# Patient Record
Sex: Male | Born: 1965
Health system: Southern US, Community
[De-identification: ages and names within clinical notes are randomized; demographics above are authoritative.]

## PROBLEM LIST (undated history)

## (undated) DIAGNOSIS — L409 Psoriasis, unspecified: Secondary | ICD-10-CM

## (undated) DIAGNOSIS — T8859XA Other complications of anesthesia, initial encounter: Secondary | ICD-10-CM

## (undated) DIAGNOSIS — R112 Nausea with vomiting, unspecified: Secondary | ICD-10-CM

## (undated) DIAGNOSIS — K219 Gastro-esophageal reflux disease without esophagitis: Secondary | ICD-10-CM

## (undated) DIAGNOSIS — Z9889 Other specified postprocedural states: Secondary | ICD-10-CM

## (undated) DIAGNOSIS — E119 Type 2 diabetes mellitus without complications: Secondary | ICD-10-CM

## (undated) HISTORY — DX: Gastro-esophageal reflux disease without esophagitis: K21.9

## (undated) HISTORY — PX: WISDOM TOOTH EXTRACTION: SHX21

---

## 1993-08-20 HISTORY — PX: OTHER SURGICAL HISTORY: SHX169

## 2018-03-26 ENCOUNTER — Encounter (HOSPITAL_COMMUNITY): Payer: Self-pay

## 2018-03-26 ENCOUNTER — Ambulatory Visit (HOSPITAL_COMMUNITY)
Admission: EM | Admit: 2018-03-26 | Discharge: 2018-03-26 | Disposition: A | Discharge status: Home / Self Care | Payer: Self-pay | Attending: Family Medicine | Admitting: Family Medicine

## 2018-03-26 ENCOUNTER — Other Ambulatory Visit: Payer: Self-pay

## 2018-03-26 DIAGNOSIS — K219 Gastro-esophageal reflux disease without esophagitis: Secondary | ICD-10-CM

## 2018-03-26 DIAGNOSIS — M79652 Pain in left thigh: Secondary | ICD-10-CM

## 2018-03-26 DIAGNOSIS — M79651 Pain in right thigh: Secondary | ICD-10-CM

## 2018-03-26 DIAGNOSIS — J029 Acute pharyngitis, unspecified: Secondary | ICD-10-CM

## 2018-03-26 LAB — POCT I-STAT, CHEM 8
BUN: 21 mg/dL — AB (ref 6–20)
CALCIUM ION: 1.09 mmol/L — AB (ref 1.15–1.40)
CREATININE: 0.9 mg/dL (ref 0.61–1.24)
Chloride: 102 mmol/L (ref 98–111)
GLUCOSE: 131 mg/dL — AB (ref 70–99)
HCT: 48 % (ref 39.0–52.0)
HEMOGLOBIN: 16.3 g/dL (ref 13.0–17.0)
Potassium: 3.8 mmol/L (ref 3.5–5.1)
Sodium: 141 mmol/L (ref 135–145)
TCO2: 26 mmol/L (ref 22–32)

## 2018-03-26 MED ORDER — OMEPRAZOLE 20 MG PO CPDR: 20.0000 mg | DELAYED_RELEASE_CAPSULE | Freq: Every day | ORAL | 1 refills | Status: DC

## 2018-03-26 MED ORDER — RANITIDINE HCL 150 MG PO CAPS: 150.0000 mg | ORAL_CAPSULE | Freq: Every day | ORAL | 0 refills | Status: DC

## 2018-03-26 NOTE — Discharge Instructions (Signed)
I believe your symptoms are associated with acid reflux.  °We are going to try omeprazole 20 mg once daily and zantac 150 mg at night.  °Make sure that you take the omeprazole 30- 60 minutes prior to a meal with a glass of water.  °Avoid spicy, greasy foods, caffeine, chocolate and milk products.  °No eating 2-3 hours before bedtime. Elevate the head of the bed 30 degrees.  °Try this for a few weeks to see if this improves your symptoms.  °If you don't see any improvement or your symptoms worsen please follow up with a GI   °

## 2018-03-26 NOTE — ED Triage Notes (Signed)
Pt feels like he's choking this has been going on for 3 week this comes and goes. And the upper thigh pain 4 weeks.

## 2018-03-26 NOTE — ED Provider Notes (Signed)
[facility]    CSN: [id number] Arrival date & time: 03/26/18  1220     History   Chief Complaint Chief Complaint  Patient presents with  . Sore Throat  . Leg Pain    HPI Richard Mullins is a 52 y.o. male.   Patient is a 52 year old male with no recorded past medical history.  He reports that he has not been to a doctor in over 15 years.  He presents today with 3 weeks of sore throat, choking sensation.  He reports that this is worse at times eating a meal and laying down.  He reports a history of acid reflux when he was younger.  He denies any associated fever, chills, body aches.  He denies any nausea, vomiting, diarrhea, constipation or blood in stools.  Denies any abdominal pain, chest pain, shortness of breath.  He admits to eating a lot of spicy, greasy foods.  He is also had a lot of drainage in the back of his throat.  Sometimes he takes Benadryl for this but mostly at night.  He does not drive and rides a bicycle everywhere he goes.   He is also complaining of bilateral thigh pain.  This is intermittent and he describes it as a muscle spasm.  This pain is worse when he is lying down or has been sitting for long periods.  The pain gets better when he is active and rides his bike.  He reports that he sometimes gets dehydrated and gets a headache from this.  He denies any calf pain.  He does have discoloration to bilateral lower extremities without swelling or pain.  He reports that this is been there for over 10 years and he suffers from psoriasis.   He does not smoke, drink or do any drugs.  He denies any history of DVT, PE.  He has had no recent traveling distance.   ROS per HPI      History reviewed. No pertinent past medical history.  There are no active problems to display for this patient.   History reviewed. No pertinent surgical history.     Home Medications    Prior to Admission medications   Not on File    Family History History reviewed. No  pertinent family history.  Social History Social History   Tobacco Use  . Smoking status: Never Smoker  . Smokeless tobacco: Never Used  Substance Use Topics  . Alcohol use: Yes    Comment: occ  . Drug use: Not Currently     Allergies   Patient has no allergy information on record.   Review of Systems Review of Systems   Physical Exam Triage Vital Signs ED Triage Vitals [03/26/18 1249]  Enc Vitals Group     BP (!) 166/84     Pulse Rate 88     Resp 18     Temp 98.4 F (36.9 C)     Temp src      SpO2 98 %     Weight (!) 350 lb (158.8 kg)     Height      Head Circumference      Peak Flow      Pain Score 3     Pain Loc      Pain Edu?      Excl. in GC?    No data found.  Updated Vital Signs BP (!) 166/84   Pulse 88   Temp 98.4 F (36.9 C)   Resp 18  Wt (!) 350 lb (158.8 kg)   SpO2 98%   Visual Acuity Right Eye Distance:   Left Eye Distance:   Bilateral Distance:    Right Eye Near:   Left Eye Near:    Bilateral Near:     Physical Exam  Constitutional: He appears well-developed and well-nourished.  Non-toxic appearance. He does not appear ill.  Obese  HENT:  Head: Normocephalic and atraumatic.  Right Ear: Hearing, tympanic membrane and ear canal normal.  Left Ear: Hearing, tympanic membrane and ear canal normal.  Mouth/Throat: Uvula is midline and mucous membranes are normal. No oral lesions. No uvula swelling. No oropharyngeal exudate, posterior oropharyngeal edema, posterior oropharyngeal erythema or tonsillar abscesses. Tonsils are 0 on the right. Tonsils are 0 on the left. No tonsillar exudate.  Drainage noted at the back of throat  Eyes: Pupils are equal, round, and reactive to light.  Cardiovascular: Normal rate, regular rhythm and normal heart sounds.  Pulmonary/Chest: Effort normal and breath sounds normal.  Lymphadenopathy:    He has no cervical adenopathy.  Neurological: He is alert.  Skin: Skin is warm and dry. Capillary refill takes  less than 2 seconds.  Discoloration to bilateral lower extremities, consistent with PVD.  No edema,  pain with palpation, increased warmth.  Sensation intact, distal pulses intact.  No skin breakdown  Psychiatric: He has a normal mood and affect.  Nursing note and vitals reviewed.    UC Treatments / Results  Labs (all labs ordered are listed, but only abnormal results are displayed) Labs Reviewed  POCT I-STAT, CHEM 8 - Abnormal; Notable for the following components:      Result Value   BUN 21 (*)    Glucose, Bld 131 (*)    Calcium, Ion 1.09 (*)    All other components within normal limits    EKG None  Radiology No results found.  Procedures Procedures (including critical care time)  Medications Ordered in UC Medications - No data to display  Initial Impression / Assessment and Plan / UC Course  I have reviewed the triage vital signs and the nursing notes.  Pertinent labs & imaging results that were available during my care of the patient were reviewed by me and considered in my medical decision making (see chart for details).     Blood work is normal.  Choking sensation and sore throat most likely due to acid reflux.  Will start omeprazole daily and Zantac as needed.  Instructions on foods to avoid given.  He can also try daily Zyrtec for allergies this may help.   The bilateral upper thigh cramping and spasming is most likely due to dehydration or electrolyte imbalance.  He is outside a lot riding his bike and has a history of dehydration.  He needs to ensure that he is drinking plenty of fluids and replacing electrolytes.  No concern for DVT.   I recommend he establish care with a primary care provider for routine health maintenance.  Patient agreeable to plan     Final Clinical Impressions(s) / UC Diagnoses   Final diagnoses:  None   Discharge Instructions   None    ED Prescriptions    None     Controlled Substance Prescriptions [place] Controlled Substance  Registry consulted? Not Applicable   Janace ArisBast, Javid Kemler A, NP 03/26/18 1432

## 2020-08-20 DIAGNOSIS — Z8616 Personal history of COVID-19: Secondary | ICD-10-CM

## 2020-08-20 HISTORY — DX: Personal history of COVID-19: Z86.16

## 2020-09-20 ENCOUNTER — Emergency Department (HOSPITAL_COMMUNITY): Payer: Self-pay

## 2020-09-20 ENCOUNTER — Encounter (HOSPITAL_COMMUNITY): Payer: Self-pay | Admitting: Emergency Medicine

## 2020-09-20 ENCOUNTER — Inpatient Hospital Stay (HOSPITAL_COMMUNITY): Payer: Self-pay

## 2020-09-20 ENCOUNTER — Other Ambulatory Visit: Payer: Self-pay

## 2020-09-20 ENCOUNTER — Inpatient Hospital Stay (HOSPITAL_COMMUNITY)
Admission: EM | Admit: 2020-09-20 | Discharge: 2020-09-26 | DRG: 637 | Disposition: A | Discharge status: Home / Self Care | Payer: Self-pay | Attending: Internal Medicine | Admitting: Internal Medicine

## 2020-09-20 DIAGNOSIS — E872 Acidosis, unspecified: Secondary | ICD-10-CM | POA: Diagnosis present

## 2020-09-20 DIAGNOSIS — K5900 Constipation, unspecified: Secondary | ICD-10-CM | POA: Diagnosis present

## 2020-09-20 DIAGNOSIS — Z888 Allergy status to other drugs, medicaments and biological substances status: Secondary | ICD-10-CM

## 2020-09-20 DIAGNOSIS — D696 Thrombocytopenia, unspecified: Secondary | ICD-10-CM | POA: Diagnosis present

## 2020-09-20 DIAGNOSIS — R112 Nausea with vomiting, unspecified: Secondary | ICD-10-CM

## 2020-09-20 DIAGNOSIS — Z20822 Contact with and (suspected) exposure to covid-19: Secondary | ICD-10-CM | POA: Diagnosis present

## 2020-09-20 DIAGNOSIS — R739 Hyperglycemia, unspecified: Secondary | ICD-10-CM

## 2020-09-20 DIAGNOSIS — E1110 Type 2 diabetes mellitus with ketoacidosis without coma: Principal | ICD-10-CM | POA: Diagnosis present

## 2020-09-20 DIAGNOSIS — I2699 Other pulmonary embolism without acute cor pulmonale: Secondary | ICD-10-CM | POA: Diagnosis present

## 2020-09-20 DIAGNOSIS — E669 Obesity, unspecified: Secondary | ICD-10-CM | POA: Diagnosis present

## 2020-09-20 DIAGNOSIS — Z8616 Personal history of COVID-19: Secondary | ICD-10-CM

## 2020-09-20 DIAGNOSIS — G9341 Metabolic encephalopathy: Secondary | ICD-10-CM | POA: Diagnosis present

## 2020-09-20 DIAGNOSIS — R7989 Other specified abnormal findings of blood chemistry: Secondary | ICD-10-CM | POA: Diagnosis present

## 2020-09-20 DIAGNOSIS — I4891 Unspecified atrial fibrillation: Secondary | ICD-10-CM | POA: Diagnosis not present

## 2020-09-20 DIAGNOSIS — Z6837 Body mass index (BMI) 37.0-37.9, adult: Secondary | ICD-10-CM

## 2020-09-20 DIAGNOSIS — L409 Psoriasis, unspecified: Secondary | ICD-10-CM | POA: Diagnosis present

## 2020-09-20 DIAGNOSIS — Z86718 Personal history of other venous thrombosis and embolism: Secondary | ICD-10-CM

## 2020-09-20 DIAGNOSIS — I248 Other forms of acute ischemic heart disease: Secondary | ICD-10-CM | POA: Diagnosis present

## 2020-09-20 DIAGNOSIS — R634 Abnormal weight loss: Secondary | ICD-10-CM

## 2020-09-20 DIAGNOSIS — Z801 Family history of malignant neoplasm of trachea, bronchus and lung: Secondary | ICD-10-CM

## 2020-09-20 DIAGNOSIS — E860 Dehydration: Secondary | ICD-10-CM | POA: Diagnosis present

## 2020-09-20 DIAGNOSIS — R000 Tachycardia, unspecified: Secondary | ICD-10-CM | POA: Diagnosis present

## 2020-09-20 DIAGNOSIS — N179 Acute kidney failure, unspecified: Secondary | ICD-10-CM | POA: Diagnosis present

## 2020-09-20 DIAGNOSIS — Z800 Family history of malignant neoplasm of digestive organs: Secondary | ICD-10-CM

## 2020-09-20 DIAGNOSIS — E870 Hyperosmolality and hypernatremia: Secondary | ICD-10-CM | POA: Diagnosis present

## 2020-09-20 DIAGNOSIS — K760 Fatty (change of) liver, not elsewhere classified: Secondary | ICD-10-CM | POA: Diagnosis present

## 2020-09-20 DIAGNOSIS — E119 Type 2 diabetes mellitus without complications: Secondary | ICD-10-CM | POA: Diagnosis present

## 2020-09-20 DIAGNOSIS — Z794 Long term (current) use of insulin: Secondary | ICD-10-CM | POA: Diagnosis present

## 2020-09-20 HISTORY — DX: Nausea with vomiting, unspecified: Z98.890

## 2020-09-20 HISTORY — DX: Other complications of anesthesia, initial encounter: T88.59XA

## 2020-09-20 HISTORY — DX: Nausea with vomiting, unspecified: R11.2

## 2020-09-20 HISTORY — DX: Type 2 diabetes mellitus without complications: E11.9

## 2020-09-20 HISTORY — DX: Personal history of other venous thrombosis and embolism: Z86.718

## 2020-09-20 HISTORY — DX: Unspecified atrial fibrillation: I48.91

## 2020-09-20 HISTORY — DX: Psoriasis, unspecified: L40.9

## 2020-09-20 LAB — COMPREHENSIVE METABOLIC PANEL
ALT: 54 U/L — ABNORMAL HIGH (ref 0–44)
AST: 32 U/L (ref 15–41)
Albumin: 4 g/dL (ref 3.5–5.0)
Alkaline Phosphatase: 65 U/L (ref 38–126)
Anion gap: 31 — ABNORMAL HIGH (ref 5–15)
BUN: 42 mg/dL — ABNORMAL HIGH (ref 6–20)
CO2: 17 mmol/L — ABNORMAL LOW (ref 22–32)
Calcium: 11.4 mg/dL — ABNORMAL HIGH (ref 8.9–10.3)
Chloride: 101 mmol/L (ref 98–111)
Creatinine, Ser: 2.36 mg/dL — ABNORMAL HIGH (ref 0.61–1.24)
GFR, Estimated: 32 mL/min — ABNORMAL LOW (ref >60)
Glucose, Bld: 1004 mg/dL (ref 70–99)
Potassium: 4.9 mmol/L (ref 3.5–5.1)
Sodium: 149 mmol/L — ABNORMAL HIGH (ref 135–145)
Total Bilirubin: 2.2 mg/dL — ABNORMAL HIGH (ref 0.3–1.2)
Total Protein: 8.5 g/dL — ABNORMAL HIGH (ref 6.5–8.1)

## 2020-09-20 LAB — URINALYSIS, ROUTINE W REFLEX MICROSCOPIC
Bilirubin Urine: NEGATIVE
Glucose, UA: >=500 mg/dL — AB
Ketones, ur: 15 mg/dL — AB
Leukocytes,Ua: NEGATIVE
Nitrite: NEGATIVE
Protein, ur: NEGATIVE mg/dL
Specific Gravity, Urine: 1.015 (ref 1.005–1.030)
pH: 5.5 (ref 5.0–8.0)

## 2020-09-20 LAB — BASIC METABOLIC PANEL
Anion gap: 23 — ABNORMAL HIGH (ref 5–15)
Anion gap: 13 (ref 5–15)
BUN: 43 mg/dL — ABNORMAL HIGH (ref 6–20)
BUN: 42 mg/dL — ABNORMAL HIGH (ref 6–20)
CO2: 19 mmol/L — ABNORMAL LOW (ref 22–32)
CO2: 24 mmol/L (ref 22–32)
Calcium: 11.2 mg/dL — ABNORMAL HIGH (ref 8.9–10.3)
Calcium: 10.6 mg/dL — ABNORMAL HIGH (ref 8.9–10.3)
Chloride: 114 mmol/L — ABNORMAL HIGH (ref 98–111)
Chloride: 119 mmol/L — ABNORMAL HIGH (ref 98–111)
Creatinine, Ser: 2.27 mg/dL — ABNORMAL HIGH (ref 0.61–1.24)
Creatinine, Ser: 1.81 mg/dL — ABNORMAL HIGH (ref 0.61–1.24)
GFR, Estimated: 33 mL/min — ABNORMAL LOW (ref >60)
GFR, Estimated: 44 mL/min — ABNORMAL LOW (ref >60)
Glucose, Bld: 734 mg/dL (ref 70–99)
Glucose, Bld: 462 mg/dL — ABNORMAL HIGH (ref 70–99)
Potassium: 4.1 mmol/L (ref 3.5–5.1)
Potassium: 3.6 mmol/L (ref 3.5–5.1)
Sodium: 156 mmol/L — ABNORMAL HIGH (ref 135–145)
Sodium: 156 mmol/L — ABNORMAL HIGH (ref 135–145)

## 2020-09-20 LAB — CBG MONITORING, ED
Glucose-Capillary: 254 mg/dL — ABNORMAL HIGH (ref 70–99)
Glucose-Capillary: 339 mg/dL — ABNORMAL HIGH (ref 70–99)
Glucose-Capillary: 358 mg/dL — ABNORMAL HIGH (ref 70–99)
Glucose-Capillary: 407 mg/dL — ABNORMAL HIGH (ref 70–99)
Glucose-Capillary: 417 mg/dL — ABNORMAL HIGH (ref 70–99)
Glucose-Capillary: 419 mg/dL — ABNORMAL HIGH (ref 70–99)
Glucose-Capillary: 480 mg/dL — ABNORMAL HIGH (ref 70–99)
Glucose-Capillary: 551 mg/dL (ref 70–99)
Glucose-Capillary: >600 mg/dL (ref 70–99)
Glucose-Capillary: >600 mg/dL (ref 70–99)
Glucose-Capillary: >600 mg/dL (ref 70–99)
Glucose-Capillary: >600 mg/dL (ref 70–99)

## 2020-09-20 LAB — LIPASE, BLOOD: Lipase: 48 U/L (ref 11–51)

## 2020-09-20 LAB — I-STAT VENOUS BLOOD GAS, ED
Acid-base deficit: 7 mmol/L — ABNORMAL HIGH (ref 0.0–2.0)
Bicarbonate: 17.4 mmol/L — ABNORMAL LOW (ref 20.0–28.0)
Calcium, Ion: 1.31 mmol/L (ref 1.15–1.40)
HCT: 55 % — ABNORMAL HIGH (ref 39.0–52.0)
Hemoglobin: 18.7 g/dL — ABNORMAL HIGH (ref 13.0–17.0)
O2 Saturation: 92 %
Potassium: 4.6 mmol/L (ref 3.5–5.1)
Sodium: 152 mmol/L — ABNORMAL HIGH (ref 135–145)
TCO2: 18 mmol/L — ABNORMAL LOW (ref 22–32)
pCO2, Ven: 32.7 mmHg — ABNORMAL LOW (ref 44.0–60.0)
pH, Ven: 7.333 (ref 7.250–7.430)
pO2, Ven: 68 mmHg — ABNORMAL HIGH (ref 32.0–45.0)

## 2020-09-20 LAB — LACTIC ACID, PLASMA
Lactic Acid, Venous: 3.2 mmol/L (ref 0.5–1.9)
Lactic Acid, Venous: 2.9 mmol/L (ref 0.5–1.9)
Lactic Acid, Venous: 2.3 mmol/L (ref 0.5–1.9)

## 2020-09-20 LAB — CBC WITH DIFFERENTIAL/PLATELET
Abs Immature Granulocytes: 0.05 10*3/uL (ref 0.00–0.07)
Basophils Absolute: 0 10*3/uL (ref 0.0–0.1)
Basophils Relative: 0 %
Eosinophils Absolute: 0 10*3/uL (ref 0.0–0.5)
Eosinophils Relative: 0 %
HCT: 59.6 % — ABNORMAL HIGH (ref 39.0–52.0)
Hemoglobin: 18.6 g/dL — ABNORMAL HIGH (ref 13.0–17.0)
Immature Granulocytes: 0 %
Lymphocytes Relative: 14 %
Lymphs Abs: 1.7 10*3/uL (ref 0.7–4.0)
MCH: 31.2 pg (ref 26.0–34.0)
MCHC: 31.2 g/dL (ref 30.0–36.0)
MCV: 100 fL (ref 80.0–100.0)
Monocytes Absolute: 0.9 10*3/uL (ref 0.1–1.0)
Monocytes Relative: 8 %
Neutro Abs: 9.8 10*3/uL — ABNORMAL HIGH (ref 1.7–7.7)
Neutrophils Relative %: 78 %
Platelets: 296 10*3/uL (ref 150–400)
RBC: 5.96 MIL/uL — ABNORMAL HIGH (ref 4.22–5.81)
RDW: 13.2 % (ref 11.5–15.5)
WBC: 12.5 10*3/uL — ABNORMAL HIGH (ref 4.0–10.5)
nRBC: 0 % (ref 0.0–0.2)

## 2020-09-20 LAB — OSMOLALITY: Osmolality: 412 mOsm/kg (ref 275–295)

## 2020-09-20 LAB — URINALYSIS, MICROSCOPIC (REFLEX)

## 2020-09-20 LAB — SARS CORONAVIRUS 2 (TAT 6-24 HRS): SARS Coronavirus 2: NEGATIVE

## 2020-09-20 LAB — HIV ANTIBODY (ROUTINE TESTING W REFLEX): HIV Screen 4th Generation wRfx: NONREACTIVE

## 2020-09-20 LAB — BETA-HYDROXYBUTYRIC ACID: Beta-Hydroxybutyric Acid: >8 mmol/L — ABNORMAL HIGH (ref 0.05–0.27)

## 2020-09-20 MED ORDER — SENNOSIDES-DOCUSATE SODIUM 8.6-50 MG PO TABS
2.0000 | ORAL_TABLET | Freq: Two times a day (BID) | ORAL | Status: DC
  Administered 2020-09-20 – 2020-09-24 (×6): 2 via ORAL
  Filled 2020-09-20 (×9): qty 2

## 2020-09-20 MED ORDER — LACTULOSE 10 GM/15ML PO SOLN
20.0000 g | Freq: Three times a day (TID) | ORAL | Status: DC
  Administered 2020-09-20 – 2020-09-24 (×6): 20 g via ORAL
  Filled 2020-09-20 (×11): qty 30

## 2020-09-20 MED ORDER — HEPARIN SODIUM (PORCINE) 5000 UNIT/ML IJ SOLN
5000.0000 [IU] | Freq: Three times a day (TID) | INTRAMUSCULAR | Status: DC
  Administered 2020-09-20 – 2020-09-22 (×5): 5000 [IU] via SUBCUTANEOUS
  Filled 2020-09-20 (×6): qty 1

## 2020-09-20 MED ORDER — LACTATED RINGERS IV BOLUS: 20.0000 mL/kg | Freq: Once | INTRAVENOUS | Status: DC

## 2020-09-20 MED ORDER — POTASSIUM CHLORIDE 10 MEQ/100ML IV SOLN
INTRAVENOUS | Status: AC
  Administered 2020-09-20: 10 meq via INTRAVENOUS
  Filled 2020-09-20: qty 100

## 2020-09-20 MED ORDER — INSULIN REGULAR(HUMAN) IN NACL 100-0.9 UT/100ML-% IV SOLN
INTRAVENOUS | Status: DC
  Administered 2020-09-21: 4.6 [IU]/h via INTRAVENOUS
  Filled 2020-09-20 (×3): qty 100

## 2020-09-20 MED ORDER — DEXTROSE 50 % IV SOLN: 0.0000 mL | INTRAVENOUS | Status: DC | PRN

## 2020-09-20 MED ORDER — POTASSIUM CHLORIDE 10 MEQ/100ML IV SOLN
10.0000 meq | INTRAVENOUS | Status: DC
  Administered 2020-09-20: 10 meq via INTRAVENOUS
  Filled 2020-09-20: qty 100

## 2020-09-20 MED ORDER — ACETAMINOPHEN 325 MG PO TABS: 650.0000 mg | ORAL_TABLET | Freq: Four times a day (QID) | ORAL | Status: DC | PRN

## 2020-09-20 MED ORDER — BISACODYL 10 MG RE SUPP: 10.0000 mg | Freq: Every day | RECTAL | Status: DC | PRN

## 2020-09-20 MED ORDER — LACTATED RINGERS IV BOLUS
20.0000 mL/kg | Freq: Once | INTRAVENOUS | Status: AC
  Administered 2020-09-20: 2686 mL via INTRAVENOUS

## 2020-09-20 MED ORDER — DEXTROSE IN LACTATED RINGERS 5 % IV SOLN: INTRAVENOUS | Status: DC

## 2020-09-20 MED ORDER — LACTATED RINGERS IV BOLUS
1000.0000 mL | Freq: Once | INTRAVENOUS | Status: AC
  Administered 2020-09-20: 1000 mL via INTRAVENOUS

## 2020-09-20 MED ORDER — POLYETHYLENE GLYCOL 3350 17 G PO PACK
17.0000 g | PACK | Freq: Every day | ORAL | Status: DC
  Administered 2020-09-21 – 2020-09-24 (×4): 17 g via ORAL
  Filled 2020-09-20 (×5): qty 1

## 2020-09-20 MED ORDER — ONDANSETRON HCL 4 MG/2ML IJ SOLN: 4.0000 mg | Freq: Four times a day (QID) | INTRAMUSCULAR | Status: DC | PRN

## 2020-09-20 MED ORDER — LACTATED RINGERS IV SOLN
INTRAVENOUS | Status: DC
  Administered 2020-09-20: 125 mL/h via INTRAVENOUS

## 2020-09-20 MED ORDER — PANTOPRAZOLE SODIUM 40 MG PO TBEC
40.0000 mg | DELAYED_RELEASE_TABLET | Freq: Every day | ORAL | Status: DC
  Administered 2020-09-20 – 2020-09-26 (×7): 40 mg via ORAL
  Filled 2020-09-20 (×7): qty 1

## 2020-09-20 MED ORDER — INSULIN REGULAR(HUMAN) IN NACL 100-0.9 UT/100ML-% IV SOLN
INTRAVENOUS | Status: DC
  Administered 2020-09-20: 8 [IU]/h via INTRAVENOUS
  Filled 2020-09-20: qty 100

## 2020-09-20 MED ORDER — LACTATED RINGERS IV SOLN: INTRAVENOUS | Status: DC

## 2020-09-20 NOTE — ED Provider Notes (Signed)
[name] EMERGENCY DEPARTMENT Provider Note   CSN: [id number] Arrival date & time: 09/20/20  1324     History Chief Complaint  Patient presents with  . Weight Loss  . dry mouth    Richard Mullins is a 55 y.o. male.  55 y.o male with a PMH presents to the ED with a chief complaint of dry mouth x 2 weeks. Patient reports associated symptoms include lack of taste budd in the last two weeks along with decrease in appetite.  He also noted to have lost 60 pounds over the past 3 months.  Patient has felt dizzy in the last couple of weeks, reports there is a major difficulty with ambulating from his room to the bathroom, he states "it is gone to a point where my main focus every day is when I am going to use the bathroom".  Patient also endorses he has been short of breath for the past 3 days, this is increased with ambulation.  He also reports polyuria, polydipsia, polyuria for the past couple of weeks.  In addition, does report vaguely abdominal pain, generalized without any focal point of tenderness.  He has had prior covid 19 [employer] vaccine. No prior medical history per patient who states " every time ive come in for a checkup to the ED I been ok". Family history of stomach CA and Lung CA. No chest, nausea or vomiting.      The history is provided by the patient and medical records.          No family history on file.  Social History   Tobacco Use  . Smoking status: Never Smoker  . Smokeless tobacco: Never Used  Substance Use Topics  . Alcohol use: Yes    Comment: occ  . Drug use: Not Currently    Home Medications Prior to Admission medications   Medication Sig Start Date End Date Taking? Authorizing Provider  omeprazole (PRILOSEC) 20 MG capsule Take 1 capsule (20 mg total) by mouth daily. 03/26/18   Dahlia Byes A, NP  ranitidine (ZANTAC) 150 MG capsule Take 1 capsule (150 mg total) by mouth daily. 03/26/18   Janace Aris, NP    Allergies     Patient has no known allergies.  Review of Systems   Review of Systems  Constitutional: Negative for fever.  HENT: Negative for sore throat.   Respiratory: Negative for shortness of breath.   Cardiovascular: Negative for chest pain.  Gastrointestinal: Negative for abdominal pain and nausea.  Genitourinary: Negative for flank pain.  Musculoskeletal: Negative for back pain.  Neurological: Negative for light-headedness and headaches.  All other systems reviewed and are negative.   Physical Exam Updated Vital Signs BP 132/87   Pulse (!) 108   Temp 98.6 F (37 C) (Oral)   Resp 20   Ht 6\' 3"  (1.905 m)   Wt 134.3 kg   SpO2 96%   BMI 37.00 kg/m   Physical Exam Vitals and nursing note reviewed.  Constitutional:      Appearance: Normal appearance. He is ill-appearing.  HENT:     Head: Normocephalic and atraumatic.     Nose: Nose normal.     Mouth/Throat:     Mouth: Mucous membranes are dry.  Eyes:     Pupils: Pupils are equal, round, and reactive to light.  Cardiovascular:     Rate and Rhythm: Tachycardia present.  Pulmonary:     Effort: Pulmonary effort is normal.  Breath sounds: No wheezing or rales.  Abdominal:     General: Abdomen is flat.     Tenderness: There is abdominal tenderness. There is no right CVA tenderness, left CVA tenderness, guarding or rebound.  Musculoskeletal:     Cervical back: Normal range of motion and neck supple.  Skin:    General: Skin is warm and dry.     Findings: Erythema present.  Neurological:     Mental Status: He is alert and oriented to person, place, and time.     ED Results / Procedures / Treatments   Labs (all labs ordered are listed, but only abnormal results are displayed) Labs Reviewed  COMPREHENSIVE METABOLIC PANEL - Abnormal; Notable for the following components:      Result Value   Sodium 149 (*)    CO2 17 (*)    Glucose, Bld 1,004 (*)    BUN 42 (*)    Creatinine, Ser 2.36 (*)    Calcium 11.4 (*)    Total  Protein 8.5 (*)    ALT 54 (*)    Total Bilirubin 2.2 (*)    GFR, Estimated 32 (*)    Anion gap 31 (*)    All other components within normal limits  CBC WITH DIFFERENTIAL/PLATELET - Abnormal; Notable for the following components:   WBC 12.5 (*)    RBC 5.96 (*)    Hemoglobin 18.6 (*)    HCT 59.6 (*)    Neutro Abs 9.8 (*)    All other components within normal limits  LACTIC ACID, PLASMA - Abnormal; Notable for the following components:   Lactic Acid, Venous 3.2 (*)    All other components within normal limits  OSMOLALITY - Abnormal; Notable for the following components:   Osmolality 412 (*)    All other components within normal limits  I-STAT VENOUS BLOOD GAS, ED - Abnormal; Notable for the following components:   pCO2, Ven 32.7 (*)    pO2, Ven 68.0 (*)    Bicarbonate 17.4 (*)    TCO2 18 (*)    Acid-base deficit 7.0 (*)    Sodium 152 (*)    HCT 55.0 (*)    Hemoglobin 18.7 (*)    All other components within normal limits  CBG MONITORING, ED - Abnormal; Notable for the following components:   Glucose-Capillary >600 (*)    All other components within normal limits  CBG MONITORING, ED - Abnormal; Notable for the following components:   Glucose-Capillary >600 (*)    All other components within normal limits  URINE CULTURE  SARS CORONAVIRUS 2 (TAT 6-24 HRS)  LIPASE, BLOOD  URINALYSIS, ROUTINE W REFLEX MICROSCOPIC    EKG EKG Interpretation  Date/Time:  Tuesday September 20 2020 13:32:02 EST Ventricular Rate:  122 PR Interval:    QRS Duration: 93 QT Interval:  329 QTC Calculation: 469 R Axis:   97 Text Interpretation: Sinus tachycardia Probable left atrial enlargement Borderline right axis deviation Probable left ventricular hypertrophy Nonspecific T abnormalities, inferior leads No old tracing to compare Confirmed by Melene Plan [phone]) on 09/20/2020 1:43:57 PM   Radiology DG Chest Portable 1 View  Result Date: 09/20/2020 CLINICAL DATA:  Shortness of breath EXAM: PORTABLE  CHEST 1 VIEW COMPARISON:  None. FINDINGS: Low lung volumes. No consolidation or edema. No pleural effusion or pneumothorax. Cardiomediastinal contours are within normal limits. IMPRESSION: No acute process in the chest. Electronically Signed   By: Guadlupe Spanish M.D.   On: 09/20/2020 14:10    Procedures .Critical  Care Performed by: Claude Manges, PA-C Authorized by: Claude Manges, PA-C   Critical care provider statement:    Critical care time (minutes):  45   Critical care start time:  09/20/2020 1:30 PM   Critical care end time:  09/20/2020 2:15 PM   Critical care time was exclusive of:  Separately billable procedures and treating other patients   Critical care was necessary to treat or prevent imminent or life-threatening deterioration of the following conditions:  Endocrine crisis   Critical care was time spent personally by me on the following activities:  Blood draw for specimens, development of treatment plan with patient or surrogate, discussions with consultants, evaluation of patient's response to treatment, examination of patient, obtaining history from patient or surrogate, ordering and performing treatments and interventions, ordering and review of laboratory studies, ordering and review of radiographic studies, pulse oximetry, re-evaluation of patient's condition and review of old charts     Medications Ordered in ED Medications  insulin regular, human (MYXREDLIN) 100 units/ 100 mL infusion (8 Units/hr Intravenous New Bag/Given 09/20/20 1527)  lactated ringers infusion (125 mL/hr Intravenous New Bag/Given 09/20/20 1530)  dextrose 5 % in lactated ringers infusion (has no administration in time range)  dextrose 50 % solution 0-50 mL (has no administration in time range)  potassium chloride 10 mEq in 100 mL IVPB (10 mEq Intravenous New Bag/Given 09/20/20 1528)  lactated ringers bolus 2,686 mL (2,686 mLs Intravenous New Bag/Given 09/20/20 1426)    ED Course  I have reviewed the triage vital  signs and the nursing notes.  Pertinent labs & imaging results that were available during my care of the patient were reviewed by me and considered in my medical decision making (see chart for details).  Clinical Course as of 09/20/20 1614  Tue Sep 20, 2020  1414 Glucose(!!): 1,004 No prior history of DM reported [JS]  1414 Creatinine(!): 2.36 [JS]  1415 Anion gap(!): 31 [JS]    Clinical Course User Index [JS] Claude Manges, PA-C   MDM Rules/Calculators/A&P     Patient with no PMH, prior chart review states that patient has seen a physician in the past >15 years based on note from 2019. He is very vague with symptoms which include dry mouth, weight loss, abdominal pain, polyuria, polydipsia, polyphagia worsen in the past 2 weeks.   Patient is nonverbal symptoms, physical abdominal pain, dry mouth, he is very concerned as he has lost 60 pounds in the past 3 months.  He denies any prior history of diabetes.  According to extensive chart review, looks like patient utilize emergency department for primary care follow-up.  Does endorse a family history of lung CA along with stomach CA.  Arrived in the ED tachycardic with a heart rate in the 120s, hypertensive with a systolic at 171/112.  He is overall ill-appearing, pharynx appears dry, lungs are clear to auscultation, there is no pain with palpation of the chest, abdomen is soft, does endorse generalized tenderness but without any focal point of tenderness.  Was vaccinated against COVID-19 via [employer] vaccine.  Has also had loss in taste, not in smell, had increased fatigue as well.  No URI symptoms on today's visit.  Denies any tingling, numbing sensation to all extremities.  No headaches, no nausea or vomiting.  Labs have been ordered while in triage.  CMP reveals a glucose of 1,004, creatinine level of 2.36, significant for AKI.  LFTs are within normal limits.  He does have a anion gap of 31, suspect  likely DKA versus HHS.  CBC remarkable for  leukocytosis of 12.5, he is hemoconcentrated with hemoglobin of 18.6.  Venous gas remarkable for a bicarb of 17.4.  Glucose stabilizer has been ordered for patient with insulin at this time.  We will also received 1 round of potassium via IV.  CBG remarkable for glucose greater than 600.  Chest x-ray reviewed by me, without any sign of acute process.  He denies any chest pain on today's visit, no signs of infection, has been afebrile at home.  Lipase level has been added onto his labs.  4:14 PM Spoke to hospitalist service, appreciate their help.Patient stable for admission.  BP 132/87   Pulse (!) 108   Temp 98.6 F (37 C) (Oral)   Resp 20   Ht 6\' 3"  (1.905 m)   Wt 134.3 kg   SpO2 96%   BMI 37.00 kg/m    Portions of this note were generated with . Dictation errors may occur despite best attempts at proofreading.  Final Clinical Impression(s) / ED Diagnoses Final diagnoses:  Weight loss  Hyperglycemia    Rx / DC Orders ED Discharge Orders    None       Scientist, clinical (histocompatibility and immunogenetics), PA-C 09/20/20 1614    11/18/20, DO 09/21/20 [phone]

## 2020-09-20 NOTE — ED Notes (Signed)
Date and time results received: 09/20/20   Test: serum osmo Critical Value:412  Name of Provider Notified: Claude Manges

## 2020-09-20 NOTE — ED Notes (Signed)
Date and time results received: 09/20/20    Test: lactic Critical Value: 3.6  Name of Provider Notified: Claude Manges

## 2020-09-20 NOTE — ED Notes (Signed)
Unable to collect pt's labs at this time, phlebotomist notified.

## 2020-09-20 NOTE — ED Triage Notes (Signed)
Pt reports dry mouth, generalized fatigue and weight loss since having covid in December.

## 2020-09-20 NOTE — H&P (Signed)
[facility] History and Physical   Patient: Richard Mullins YQM:[id number]   PCP: Patient, No Pcp Per DOB: 1966/01/23   DOA: 09/20/2020   DOS: 09/20/2020   DOS: the patient was seen and examined on 09/20/2020 Patient coming from: The patient is coming from Home  Chief Complaint: Presents with complaints of abdominal pain as well as dry mouth ongoing for last 3 days  HPI: Jedrick Hutcherson is a 55 y.o. male with Past medical history of obesity. Patient presents to the hospital with complaints of dry mouth.  Patient is a very poor historian.  Tells me that for last 6 months he is trying to lose weight by drinking a lot of water.  Around Thanksgiving he started having complaints of dizziness and lightheadedness. Somewhere around the same time he also had some blurry vision. Later on and around [place] time he started having fatigue and tiredness along with the dizziness whenever he was changing his position. For last 3 days he started having complaints of severe dry mouth.  He was able ambulate without any problems but due to dizziness as well as fatigue and tiredness this has become difficult in last 3 days. He denies having any diarrhea but reports constipation. He has some abdominal pain which is currently resolved. He denies any burning urination. He denies any chest pain denies any shortness of breath denies any fever or chills. He thinks that Covid is causing him to have diabetes although he has never been diagnosed with Covid or never had symptoms of Covid or never been hospitalized for Covid. He does not see any providers on a regular basis. He tells me that he is compliant with following up with ophthalmology as well as dentistry. 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  He does not have insurance or financial means to see a provider for him. Denies any drug abuse denies any alcohol abuse denies any  smoking. Denies any family history of diabetes but has positive family history of cancer.  ED Course: Found to have a blood sugar of 1000 as well as anion gap. IV fluids IV insulin were initiated.  The patient was referred for admission.  Review of Systems: as mentioned in the history of present illness.  All other systems reviewed and are negative.  Past Medical History:  Diagnosis Date  . Psoriasis    History reviewed. No pertinent surgical history. Social History:  reports that he has never smoked. He has never used smokeless tobacco. He reports current alcohol use. He reports previous drug use.  No Known Allergies  Family history reviewed and not pertinent Family History  Problem Relation Age of Onset  . Cancer Father      Prior to Admission medications   Not on File    Physical Exam: Vitals:   09/20/20 1530 09/20/20 1600 09/20/20 1856 09/20/20 1857  BP: (!) 160/101 132/87    Pulse: (!) 110 (!) 108 (!) 105 (!) 105  Resp: 14 20 15 16   Temp:      TempSrc:      SpO2: 95% 96% 92% 90%  Weight:      Height:        General: alert and oriented to time, place, and person. Appear in mild distress, affect flat in affect Eyes: PERRL, Conjunctiva normal ENT: Oral Mucosa Clear, dry  Neck: no JVD, no Abnormal Mass Or lumps Cardiovascular: S1 and S2 Present, nop Murmur,  peripheral pulses symmetrical Respiratory: good respiratory effort, Bilateral Air entry equal and Decreased, no signs of accessory muscle use, Clear to Auscultation, no Crackles, no wheezes Abdomen: Bowel Sound present, Soft and no tenderness, no hernia Skin: no rashes  Extremities: no Pedal edema, no calf tenderness Neurologic: without any new focal findings Gait not checked due to patient safety concerns  Data Reviewed: I have personally reviewed and interpreted labs, imaging as discussed below.  CBC: Recent Labs  Lab 09/20/20 1010 09/20/20 1449  WBC 12.5*  --   NEUTROABS 9.8*  --   HGB 18.6*  18.7*  HCT 59.6* 55.0*  MCV 100.0  --   PLT 296  --    Basic Metabolic Panel: Recent Labs  Lab 09/20/20 1010 09/20/20 1449 09/20/20 1713  NA 149* 152* 156*  K 4.9 4.6 4.1  CL 101  --  114*  CO2 17*  --  19*  GLUCOSE 1,004*  --  734*  BUN 42*  --  43*  CREATININE 2.36*  --  2.27*  CALCIUM 11.4*  --  11.2*   GFR: Estimated Creatinine Clearance: 54.9 mL/min (A) (by C-G formula based on SCr of 2.27 mg/dL (H)). Liver Function Tests: Recent Labs  Lab 09/20/20 1010  AST 32  ALT 54*  ALKPHOS 65  BILITOT 2.2*  PROT 8.5*  ALBUMIN 4.0   Recent Labs  Lab 09/20/20 1010  LIPASE 48   No results for input(s): AMMONIA in the last 168 hours. Coagulation Profile: No results for input(s): INR, PROTIME in the last 168 hours. Cardiac Enzymes: No results for input(s): CKTOTAL, CKMB, CKMBINDEX, TROPONINI in the last 168 hours. BNP (last 3 results) No results for input(s): PROBNP in the last 8760 hours. HbA1C: No results for input(s): HGBA1C in the last 72 hours. CBG: Recent Labs  Lab 09/20/20 1516 09/20/20 1606 09/20/20 1656 09/20/20 1735 09/20/20 1831  GLUCAP >600* >600* >600* >600* 551*   Lipid Profile: No results for input(s): CHOL, HDL, LDLCALC, TRIG, CHOLHDL, LDLDIRECT in the last 72 hours. Thyroid Function Tests: No results for input(s): TSH, T4TOTAL, FREET4, T3FREE, THYROIDAB in the last 72 hours. Anemia Panel: No results for input(s): VITAMINB12, FOLATE, FERRITIN, TIBC, IRON, RETICCTPCT in the last 72 hours. Urine analysis:    Component Value Date/Time   COLORURINE YELLOW 09/20/2020 1603   APPEARANCEUR CLEAR 09/20/2020 1603   LABSPEC 1.015 09/20/2020 1603   PHURINE 5.5 09/20/2020 1603   GLUCOSEU >=500 (A) 09/20/2020 1603   HGBUR SMALL (A) 09/20/2020 1603   BILIRUBINUR NEGATIVE 09/20/2020 1603   KETONESUR 15 (A) 09/20/2020 1603   PROTEINUR NEGATIVE 09/20/2020 1603   NITRITE NEGATIVE 09/20/2020 1603   LEUKOCYTESUR NEGATIVE 09/20/2020 1603    Radiological  Exams on Admission: DG Chest Portable 1 View  Result Date: 09/20/2020 CLINICAL DATA:  Shortness of breath EXAM: PORTABLE CHEST 1 VIEW COMPARISON:  None. FINDINGS: Low lung volumes. No consolidation or edema. No pleural effusion or pneumothorax. Cardiomediastinal contours are within normal limits. IMPRESSION: No acute process in the chest. Electronically Signed   By: Guadlupe Spanish M.D.   On: 09/20/2020 14:10   DG Abd Portable 1V  Result Date: 09/20/2020 CLINICAL DATA:  Intractable nausea and vomiting. EXAM: PORTABLE ABDOMEN - 1 VIEW COMPARISON:  None. FINDINGS: The bowel gas pattern is normal. No radio-opaque calculi or other significant radiographic abnormality are seen. IMPRESSION: Negative. Electronically Signed   By: Katherine Mantle M.D.   On: 09/20/2020 16:50   EKG: Independently reviewed. normal EKG, normal sinus rhythm, normal  sinus rhythm, sinus tachycardia.  I reviewed all nursing notes, pharmacy notes, vitals, pertinent old records.  Assessment/Plan 1. DKA, type 2 (HCC)  New onset type 2 diabetes mellitus. Lactic acid elevation. AKI. On presentation blood glucose in 1000. CO2 17. Anion gap 31. Sodium 141. LFTs elevated bilirubin elevated as well as serum creatinine elevated. Baseline serum creatinine 0.9 Currently started on LR bolus followed by IV fluid. Also started on IV insulin. We will continue with Endo tool. NPO. Patient does not have any insurance. We get our social worker to work with the patient for medication management as well as PCP. Will need education for insulin as well.  2.  Acute kidney injury Hypernatremia Metabolic acidosis Hypercalcemia. Baseline serum creatinine 0.9. On presentation 2.36. BUN also elevated. Dehydration from DKA. Continue with aggressive IV hydration. Monitor renal function avoid nephrotoxic medication.  3.  LFT elevation. Likely in the setting of dehydration. Continue with IV fluids and monitor. Avoid hepatotoxic  medication.  4.  Lactic acid elevation. No evidence of sepsis on clinical examination. Likely dehydration. Continue with aggressive IV hydration. No further septic work-up. Sepsis ruled out.  5.  Sinus tachycardia. In the setting of dehydration. Continue with aggressive IV hydration.  6.  Abdominal pain. Severe constipation. Patient reported with abdominal pain currently pain resolved. X-ray shows evidence of constipation but Continue bowel regimen.  7.  Obesity. BMI 37. At risk for poor outcome.  Nutrition: NPO  DVT Prophylaxis: Subcutaneous Heparin   Advance goals of care discussion: Full code   Consults: none   Family Communication: no family was present at bedside, at the time of interview.   Disposition:  From: Home  Author: Lynden Oxford, MD [facility] 09/20/2020 7:00 PM   To reach On-call, see care teams to locate the attending and reach out to them via [email or web address]. If 7PM-7AM, please contact night-coverage If you still have difficulty reaching the attending provider, please page the [facility] (Director on Call) for [facility] on amion for assistance.

## 2020-09-21 DIAGNOSIS — E870 Hyperosmolality and hypernatremia: Secondary | ICD-10-CM

## 2020-09-21 DIAGNOSIS — E872 Acidosis: Secondary | ICD-10-CM

## 2020-09-21 DIAGNOSIS — N179 Acute kidney failure, unspecified: Secondary | ICD-10-CM

## 2020-09-21 DIAGNOSIS — E860 Dehydration: Secondary | ICD-10-CM

## 2020-09-21 LAB — CBG MONITORING, ED
Glucose-Capillary: 126 mg/dL — ABNORMAL HIGH (ref 70–99)
Glucose-Capillary: 141 mg/dL — ABNORMAL HIGH (ref 70–99)
Glucose-Capillary: 166 mg/dL — ABNORMAL HIGH (ref 70–99)
Glucose-Capillary: 172 mg/dL — ABNORMAL HIGH (ref 70–99)
Glucose-Capillary: 172 mg/dL — ABNORMAL HIGH (ref 70–99)
Glucose-Capillary: 187 mg/dL — ABNORMAL HIGH (ref 70–99)
Glucose-Capillary: 195 mg/dL — ABNORMAL HIGH (ref 70–99)
Glucose-Capillary: 200 mg/dL — ABNORMAL HIGH (ref 70–99)
Glucose-Capillary: 204 mg/dL — ABNORMAL HIGH (ref 70–99)
Glucose-Capillary: 216 mg/dL — ABNORMAL HIGH (ref 70–99)
Glucose-Capillary: 224 mg/dL — ABNORMAL HIGH (ref 70–99)
Glucose-Capillary: 253 mg/dL — ABNORMAL HIGH (ref 70–99)
Glucose-Capillary: 259 mg/dL — ABNORMAL HIGH (ref 70–99)
Glucose-Capillary: 261 mg/dL — ABNORMAL HIGH (ref 70–99)
Glucose-Capillary: 262 mg/dL — ABNORMAL HIGH (ref 70–99)
Glucose-Capillary: 282 mg/dL — ABNORMAL HIGH (ref 70–99)
Glucose-Capillary: 290 mg/dL — ABNORMAL HIGH (ref 70–99)
Glucose-Capillary: 298 mg/dL — ABNORMAL HIGH (ref 70–99)

## 2020-09-21 LAB — BASIC METABOLIC PANEL
Anion gap: 15 (ref 5–15)
Anion gap: 15 (ref 5–15)
BUN: 38 mg/dL — ABNORMAL HIGH (ref 6–20)
BUN: 37 mg/dL — ABNORMAL HIGH (ref 6–20)
CO2: 24 mmol/L (ref 22–32)
CO2: 24 mmol/L (ref 22–32)
Calcium: 10.9 mg/dL — ABNORMAL HIGH (ref 8.9–10.3)
Calcium: 10.3 mg/dL (ref 8.9–10.3)
Chloride: 122 mmol/L — ABNORMAL HIGH (ref 98–111)
Chloride: 122 mmol/L — ABNORMAL HIGH (ref 98–111)
Creatinine, Ser: 1.63 mg/dL — ABNORMAL HIGH (ref 0.61–1.24)
Creatinine, Ser: 1.6 mg/dL — ABNORMAL HIGH (ref 0.61–1.24)
GFR, Estimated: 50 mL/min — ABNORMAL LOW (ref >60)
GFR, Estimated: 51 mL/min — ABNORMAL LOW (ref >60)
Glucose, Bld: 242 mg/dL — ABNORMAL HIGH (ref 70–99)
Glucose, Bld: 292 mg/dL — ABNORMAL HIGH (ref 70–99)
Potassium: 3.7 mmol/L (ref 3.5–5.1)
Potassium: 3.4 mmol/L — ABNORMAL LOW (ref 3.5–5.1)
Sodium: 161 mmol/L (ref 135–145)
Sodium: 161 mmol/L (ref 135–145)

## 2020-09-21 LAB — BETA-HYDROXYBUTYRIC ACID
Beta-Hydroxybutyric Acid: 2.03 mmol/L — ABNORMAL HIGH (ref 0.05–0.27)
Beta-Hydroxybutyric Acid: 2.97 mmol/L — ABNORMAL HIGH (ref 0.05–0.27)

## 2020-09-21 LAB — HEMOGLOBIN A1C
Hgb A1c MFr Bld: >15.5 % — ABNORMAL HIGH (ref 4.8–5.6)
Mean Plasma Glucose: >398 mg/dL

## 2020-09-21 MED ORDER — DEXTROSE-NACL 5-0.45 % IV SOLN: INTRAVENOUS | Status: DC

## 2020-09-21 MED ORDER — INSULIN STARTER KIT- PEN NEEDLES (ENGLISH)
1.0000 | Freq: Once | Status: DC
  Filled 2020-09-21: qty 1

## 2020-09-21 NOTE — ED Notes (Signed)
Pt request speak with "Diabetes people"

## 2020-09-21 NOTE — ED Notes (Signed)
Checked patient cbg it was 45 notified RN of blood sugar patient is resting with call bell in reach

## 2020-09-21 NOTE — ED Notes (Signed)
Lunch tray set up at bedside

## 2020-09-21 NOTE — ED Notes (Signed)
Lab advised hgb a1c had to be sent out and should be resulted around 1900 this evening.

## 2020-09-21 NOTE — ED Notes (Signed)
Checked patient cbg it was 200 notified RN of blood sugar patient is resting with call bell in reach

## 2020-09-21 NOTE — Progress Notes (Signed)
PROGRESS NOTE  Richard Mullins  VCB:[id number] DOB: Sep 26, 1965 DOA: 09/20/2020 PCP: None Brief Narrative: Richard Mullins is a 55 y.o. male with limited medical follow up who presented to the ED with several weeks of polyuria, polydipsia, mental clouding, blurry vision who was found to be in DKA with new diagnosis of T2DM. IV insulin and IVF have been given, diabetes education starting, with plans to convert to basal-bolus insulin, HbA1c pending.   Assessment & Plan: Principal Problem:   DKA, type 2 (HCC) Active Problems:   AKI (acute kidney injury) (HCC)   Lactic acidosis   Hypernatremia   Hypercalcemia   Dehydration  DKA, lactic acidosis, new diagnosis T2DM: Symptoms of hyperglycemia dating several months.  - HbA1c pending - Ketones still not cleared, will continue dextrose-containing IVF, insulin gtt, check serial metabolic panels, BHB.  - Diabetes coordinator consulted.  - Dietitian consulted  Acute metabolic encephalopathy: Due to severe hyperglycemia, still present due to DKA.  - Supportive care. Education efforts may be more effective once this improves more.   AKI: Improving.  - Continue IVF, unclear baseline.   Hypernatremia, hypercalcemia: Improving slightly (when admission Na 149 corrected for glucose >1000, level was actually ~163mg /dl). Suspect ongoing hemoconcentration. - Change to hypotonic saline.   LFT elevation: Monitor.  Obesity: Estimated body mass index is 37 kg/m as calculated from the following:   Height as of this encounter: 6\' 3"  (1.905 m).   Weight as of this encounter: 134.3 kg.  DVT prophylaxis: Heparin Code Status: Full Family Communication: None at bedside Disposition Plan:  Status is: Inpatient  Remains inpatient appropriate because:Persistent severe electrolyte disturbances, Altered mental status, Ongoing diagnostic testing needed not appropriate for outpatient work up and Inpatient level of care appropriate due to severity of  illness   Dispo: The patient is from: Home              Anticipated d/c is to: Home              Anticipated d/c date is: 2 days              Patient currently is not medically stable to d/c.   Difficult to place patient No  Consultants:   None  Procedures:   None  Antimicrobials:  None   Subjective: Still foggy headed but oriented. Slow speech and poor historian, but denies any pain, dyspnea, N/V/D. Vision slightly less blurry currently. No fever.   Objective: Vitals:   09/21/20 0724 09/21/20 0800 09/21/20 0845 09/21/20 0900  BP: 138/70 111/84 (!) 146/89 134/83  Pulse: (!) 101 (!) 105 (!) 109 (!) 102  Resp: 15     Temp: 98.5 F (36.9 C)     TempSrc: Oral     SpO2: 97% 95% 97% 93%  Weight:      Height:        Intake/Output Summary (Last 24 hours) at 09/21/2020 0935 Last data filed at 09/20/2020 1906 Gross per 24 hour  Intake 3668.45 ml  Output 600 ml  Net 3068.45 ml   Filed Weights   09/20/20 1002  Weight: 134.3 kg    Gen: 55 y.o. male in no distress Pulm: Non-labored breathing room air, slightly tachypneic. Clear to auscultation bilaterally.  CV: Regular rate and rhythm. No murmur, rub, or gallop. No JVD, no pedal edema. GI: Abdomen soft, non-tender, non-distended, with normoactive bowel sounds. No organomegaly or masses felt. Ext: Warm, no deformities Skin: No rashes, lesions or ulcers Neuro: Alert and oriented. No focal neurological deficits.  Psych: Judgement and insight appear normal, speech is slowed but linear. Mood & affect appropriate.   Data Reviewed: I have personally reviewed following labs and imaging studies  CBC: Recent Labs  Lab 09/20/20 1010 09/20/20 1449  WBC 12.5*  --   NEUTROABS 9.8*  --   HGB 18.6* 18.7*  HCT 59.6* 55.0*  MCV 100.0  --   PLT 296  --    Basic Metabolic Panel: Recent Labs  Lab 09/20/20 1010 09/20/20 1449 09/20/20 1713 09/20/20 2045 09/21/20 0204  NA 149* 152* 156* 156* 161*  K 4.9 4.6 4.1 3.6 3.7  CL 101   --  114* 119* 122*  CO2 17*  --  19* 24 24  GLUCOSE 1,004*  --  734* 462* 242*  BUN 42*  --  43* 42* 38*  CREATININE 2.36*  --  2.27* 1.81* 1.63*  CALCIUM 11.4*  --  11.2* 10.6* 10.9*   GFR: Estimated Creatinine Clearance: 76.5 mL/min (A) (by C-G formula based on SCr of 1.63 mg/dL (H)). Liver Function Tests: Recent Labs  Lab 09/20/20 1010  AST 32  ALT 54*  ALKPHOS 65  BILITOT 2.2*  PROT 8.5*  ALBUMIN 4.0   Recent Labs  Lab 09/20/20 1010  LIPASE 48   No results for input(s): AMMONIA in the last 168 hours. Coagulation Profile: No results for input(s): INR, PROTIME in the last 168 hours. Cardiac Enzymes: No results for input(s): CKTOTAL, CKMB, CKMBINDEX, TROPONINI in the last 168 hours. BNP (last 3 results) No results for input(s): PROBNP in the last 8760 hours. HbA1C: No results for input(s): HGBA1C in the last 72 hours. CBG: Recent Labs  Lab 09/21/20 0104 09/21/20 0220 09/21/20 0413 09/21/20 0600 09/21/20 0745  GLUCAP 224* 298* 126* 195* 261*   Lipid Profile: No results for input(s): CHOL, HDL, LDLCALC, TRIG, CHOLHDL, LDLDIRECT in the last 72 hours. Thyroid Function Tests: No results for input(s): TSH, T4TOTAL, FREET4, T3FREE, THYROIDAB in the last 72 hours. Anemia Panel: No results for input(s): VITAMINB12, FOLATE, FERRITIN, TIBC, IRON, RETICCTPCT in the last 72 hours. Urine analysis:    Component Value Date/Time   COLORURINE YELLOW 09/20/2020 1603   APPEARANCEUR CLEAR 09/20/2020 1603   LABSPEC 1.015 09/20/2020 1603   PHURINE 5.5 09/20/2020 1603   GLUCOSEU >=500 (A) 09/20/2020 1603   HGBUR SMALL (A) 09/20/2020 1603   BILIRUBINUR NEGATIVE 09/20/2020 1603   KETONESUR 15 (A) 09/20/2020 1603   PROTEINUR NEGATIVE 09/20/2020 1603   NITRITE NEGATIVE 09/20/2020 1603   LEUKOCYTESUR NEGATIVE 09/20/2020 1603   Recent Results (from the past 240 hour(s))  SARS CORONAVIRUS 2 (TAT 6-24 HRS) Nasopharyngeal Nasopharyngeal Swab     Status: None   Collection Time:  09/20/20  3:20 PM   Specimen: Nasopharyngeal Swab  Result Value Ref Range Status   SARS Coronavirus 2 NEGATIVE NEGATIVE Final    Comment: (NOTE) SARS-CoV-2 target nucleic acids are NOT DETECTED.  The SARS-CoV-2 RNA is generally detectable in upper and lower respiratory specimens during the acute phase of infection. Negative results do not preclude SARS-CoV-2 infection, do not rule out co-infections with other pathogens, and should not be used as the sole basis for treatment or other patient management decisions. Negative results must be combined with clinical observations, patient history, and epidemiological information. The expected result is Negative.  Fact Sheet for Patients: HairSlick.no  Fact Sheet for Healthcare Providers: quierodirigir.com  This test is not yet approved or cleared by the [place] FDA and  has been authorized for detection and/or  diagnosis of SARS-CoV-2 by FDA under an Emergency Use Authorization (EUA). This EUA will remain  in effect (meaning this test can be used) for the duration of the COVID-19 declaration under Se ction 564(b)(1) of the Act, 21 U.S.C. section 360bbb-3(b)(1), unless the authorization is terminated or revoked sooner.  Performed at [facility], [address], [place], [place] [address]       Radiology Studies: DG Chest Portable 1 View  Result Date: 09/20/2020 CLINICAL DATA:  Shortness of breath EXAM: PORTABLE CHEST 1 VIEW COMPARISON:  None. FINDINGS: Low lung volumes. No consolidation or edema. No pleural effusion or pneumothorax. Cardiomediastinal contours are within normal limits. IMPRESSION: No acute process in the chest. Electronically Signed   By: Guadlupe Spanish M.D.   On: 09/20/2020 14:10   DG Abd Portable 1V  Result Date: 09/20/2020 CLINICAL DATA:  Intractable nausea and vomiting. EXAM: PORTABLE ABDOMEN - 1 VIEW COMPARISON:  None. FINDINGS: The bowel gas pattern  is normal. No radio-opaque calculi or other significant radiographic abnormality are seen. IMPRESSION: Negative. Electronically Signed   By: Katherine Mantle M.D.   On: 09/20/2020 16:50    Scheduled Meds: . heparin  5,000 Units Subcutaneous Q8H  . lactulose  20 g Oral TID  . pantoprazole  40 mg Oral Daily  . polyethylene glycol  17 g Oral Daily  . senna-docusate  2 tablet Oral BID   Continuous Infusions: . dextrose 5% lactated ringers 125 mL/hr at 09/21/20 0415  . insulin 8.5 Units/hr (09/21/20 0755)  . lactated ringers Stopped (09/21/20 0740)     LOS: 1 day   Time spent: 35 minutes.  Tyrone Nine, MD [facility] [email or web address] 09/21/2020, 9:35 AM

## 2020-09-21 NOTE — Progress Notes (Signed)
Inpatient Diabetes Program Recommendations  AACE/ADA: New Consensus Statement on Inpatient Glycemic Control (2015)  Target Ranges:  Prepandial:   less than 140 mg/dL      Peak postprandial:   less than 180 mg/dL (1-2 hours)      Critically ill patients:  140 - 180 mg/dL   Lab Results  Component Value Date   GLUCAP 200 (H) 09/21/2020    Review  of Glycemic Control Results for Richard Mullins, Richard Mullins (MRN [id number]) as of 09/21/2020 13:24  Ref. Range 09/21/2020 06:00 09/21/2020 07:45 09/21/2020 09:42 09/21/2020 11:33 09/21/2020 12:52  Glucose-Capillary Latest Ref Range: 70 - 99 mg/dL 195 (H) 261 (H) 262 (H) 204 (H) 200 (H)   Diabetes history: DM 2- New diagnosis Outpatient Diabetes medications: None Current orders for Inpatient glycemic control:  IV insulin  Inpatient Diabetes Program Recommendations:    Spoke with patient regarding new diagnosis of DM.  A1C pending. Spoke with pt about new diagnosis.  Discussed A1C results with him and explained what an A1C is, basic pathophysiology of DM Type 2, basic home care, importance of checking CBGs and maintaining good CBG control to prevent long-term and short-term complications.  Reviewed signs and symptoms of hyperglycemia and hypoglycemia.  RNs to provide ongoing basic DM education at bedside with this patient.  Have ordered educational booklet, insulin starter kit, and DM videos. Briefly discussed dietary modifications including plate method and eliminating sugar from beverages.  Patient does not drive but uses public transportation and bikes.  He does not have medical insurance so f/u and cost may be a barrier.  Will place [facility] consult for patient.  May need more affordable insulin regimen such as Novolin Reli-on 70/30- Needs insulin teaching as well.  Will order insulin starter kit.   Thanks,  Adah Perl, RN, BC-ADM Inpatient Diabetes Coordinator Pager [phone] (8a-5p)

## 2020-09-22 ENCOUNTER — Encounter (HOSPITAL_COMMUNITY): Payer: Self-pay | Admitting: Internal Medicine

## 2020-09-22 ENCOUNTER — Inpatient Hospital Stay (HOSPITAL_COMMUNITY): Payer: Self-pay

## 2020-09-22 DIAGNOSIS — I4891 Unspecified atrial fibrillation: Secondary | ICD-10-CM

## 2020-09-22 LAB — T4, FREE: Free T4: 1.13 ng/dL — ABNORMAL HIGH (ref 0.61–1.12)

## 2020-09-22 LAB — CBG MONITORING, ED
Glucose-Capillary: 140 mg/dL — ABNORMAL HIGH (ref 70–99)
Glucose-Capillary: 163 mg/dL — ABNORMAL HIGH (ref 70–99)
Glucose-Capillary: 166 mg/dL — ABNORMAL HIGH (ref 70–99)
Glucose-Capillary: 174 mg/dL — ABNORMAL HIGH (ref 70–99)
Glucose-Capillary: 195 mg/dL — ABNORMAL HIGH (ref 70–99)
Glucose-Capillary: 285 mg/dL — ABNORMAL HIGH (ref 70–99)

## 2020-09-22 LAB — MAGNESIUM: Magnesium: 2.4 mg/dL (ref 1.7–2.4)

## 2020-09-22 LAB — TROPONIN I (HIGH SENSITIVITY): Troponin I (High Sensitivity): 40 ng/L — ABNORMAL HIGH (ref <18)

## 2020-09-22 LAB — ECHOCARDIOGRAM COMPLETE
Area-P 1/2: 3.17 cm2
Height: 75 in
Weight: 4736 oz

## 2020-09-22 LAB — GLUCOSE, CAPILLARY
Glucose-Capillary: 275 mg/dL — ABNORMAL HIGH (ref 70–99)
Glucose-Capillary: 325 mg/dL — ABNORMAL HIGH (ref 70–99)

## 2020-09-22 LAB — POTASSIUM: Potassium: 3.3 mmol/L — ABNORMAL LOW (ref 3.5–5.1)

## 2020-09-22 LAB — TSH: TSH: 0.25 u[IU]/mL — ABNORMAL LOW (ref 0.350–4.500)

## 2020-09-22 LAB — BETA-HYDROXYBUTYRIC ACID: Beta-Hydroxybutyric Acid: 0.26 mmol/L (ref 0.05–0.27)

## 2020-09-22 LAB — MRSA PCR SCREENING: MRSA by PCR: NEGATIVE

## 2020-09-22 LAB — HEPARIN LEVEL (UNFRACTIONATED)
Heparin Unfractionated: 0.98 IU/mL — ABNORMAL HIGH (ref 0.30–0.70)
Heparin Unfractionated: 1.06 IU/mL — ABNORMAL HIGH (ref 0.30–0.70)

## 2020-09-22 LAB — D-DIMER, QUANTITATIVE: D-Dimer, Quant: 4.14 ug/mL-FEU — ABNORMAL HIGH (ref 0.00–0.50)

## 2020-09-22 MED ORDER — METOPROLOL TARTRATE 25 MG PO TABS
25.0000 mg | ORAL_TABLET | Freq: Two times a day (BID) | ORAL | Status: DC
  Administered 2020-09-22 – 2020-09-23 (×3): 25 mg via ORAL
  Filled 2020-09-22 (×3): qty 1

## 2020-09-22 MED ORDER — INSULIN GLARGINE 100 UNIT/ML ~~LOC~~ SOLN
50.0000 [IU] | Freq: Every day | SUBCUTANEOUS | Status: DC
  Administered 2020-09-22 – 2020-09-23 (×2): 50 [IU] via SUBCUTANEOUS
  Filled 2020-09-22 (×2): qty 0.5

## 2020-09-22 MED ORDER — LIVING WELL WITH DIABETES BOOK
Freq: Once | Status: DC
  Filled 2020-09-22: qty 1

## 2020-09-22 MED ORDER — INSULIN ASPART 100 UNIT/ML ~~LOC~~ SOLN
8.0000 [IU] | Freq: Three times a day (TID) | SUBCUTANEOUS | Status: DC
  Administered 2020-09-22 – 2020-09-23 (×4): 8 [IU] via SUBCUTANEOUS

## 2020-09-22 MED ORDER — TRIAMCINOLONE ACETONIDE 0.1 % EX CREA
TOPICAL_CREAM | Freq: Three times a day (TID) | CUTANEOUS | Status: DC
  Administered 2020-09-23 – 2020-09-25 (×2): 1 via TOPICAL
  Filled 2020-09-22 (×2): qty 15

## 2020-09-22 MED ORDER — HEPARIN (PORCINE) 25000 UT/250ML-% IV SOLN
1500.0000 [IU]/h | INTRAVENOUS | Status: DC
  Administered 2020-09-23: 1500 [IU]/h via INTRAVENOUS
  Filled 2020-09-22 (×2): qty 250

## 2020-09-22 MED ORDER — INSULIN ASPART 100 UNIT/ML ~~LOC~~ SOLN
0.0000 [IU] | Freq: Three times a day (TID) | SUBCUTANEOUS | Status: DC
  Administered 2020-09-22 (×2): 11 [IU] via SUBCUTANEOUS
  Administered 2020-09-23: 7 [IU] via SUBCUTANEOUS
  Administered 2020-09-23 (×2): 11 [IU] via SUBCUTANEOUS
  Administered 2020-09-24 (×2): 7 [IU] via SUBCUTANEOUS
  Administered 2020-09-24: 4 [IU] via SUBCUTANEOUS
  Administered 2020-09-25: 11 [IU] via SUBCUTANEOUS
  Administered 2020-09-25 (×2): 4 [IU] via SUBCUTANEOUS
  Administered 2020-09-26: 11 [IU] via SUBCUTANEOUS
  Administered 2020-09-26: 7 [IU] via SUBCUTANEOUS

## 2020-09-22 MED ORDER — INSULIN STARTER KIT- PEN NEEDLES (ENGLISH): 1.0000 | Freq: Once | Status: DC

## 2020-09-22 MED ORDER — PERFLUTREN LIPID MICROSPHERE
1.0000 mL | INTRAVENOUS | Status: AC | PRN
  Administered 2020-09-22: 3 mL via INTRAVENOUS
  Filled 2020-09-22: qty 10

## 2020-09-22 MED ORDER — INSULIN ASPART 100 UNIT/ML ~~LOC~~ SOLN
0.0000 [IU] | Freq: Every day | SUBCUTANEOUS | Status: DC
  Administered 2020-09-22: 4 [IU] via SUBCUTANEOUS

## 2020-09-22 MED ORDER — HEPARIN BOLUS VIA INFUSION
3000.0000 [IU] | Freq: Once | INTRAVENOUS | Status: AC
  Administered 2020-09-22: 3000 [IU] via INTRAVENOUS
  Filled 2020-09-22: qty 3000

## 2020-09-22 MED ORDER — DILTIAZEM HCL-DEXTROSE 125-5 MG/125ML-% IV SOLN (PREMIX)
5.0000 mg/h | INTRAVENOUS | Status: DC
  Administered 2020-09-22: 5 mg/h via INTRAVENOUS
  Filled 2020-09-22: qty 125

## 2020-09-22 MED ORDER — IOHEXOL 350 MG/ML SOLN
100.0000 mL | Freq: Once | INTRAVENOUS | Status: AC | PRN
  Administered 2020-09-22: 100 mL via INTRAVENOUS

## 2020-09-22 MED ORDER — HEPARIN (PORCINE) 25000 UT/250ML-% IV SOLN
1700.0000 [IU]/h | INTRAVENOUS | Status: DC
  Administered 2020-09-22: 1900 [IU]/h via INTRAVENOUS
  Administered 2020-09-22: 1700 [IU]/h via INTRAVENOUS
  Filled 2020-09-22 (×2): qty 250

## 2020-09-22 NOTE — Progress Notes (Addendum)
Floor coverage overnight event  Patient admitted for DKA on insulin drip and IV fluids.  Notified by RN that patient went into A. fib with rate up to 160s.  Patient has no complaints.  Not hypotensive.  EKG showing A. fib with RVR, diffuse ST/T wave changes likely rate related.    No documented history of A. Fib.  -Cardizem drip started -Stat labs ordered including high-sensitivity troponin, TSH, mag, potassium -Stat chest x-ray ordered -Echocardiogram ordered -[id number] 1 (based on new diagnosis of diabetes)  Addendum: Also check stat D-dimer level.  Addendum: D-dimer elevated at 4.14. He tested negative for Covid at the time of admission 2 days ago. Since his renal function has improved on labs, stat CT angiogram chest has been ordered to rule out PE as a possible precipitating factor for new onset A. fib with RVR.  Addendum 5:49 AM: Notified by radiologist on the phone that CT angiogram is positive for lobar and segmental PE.  Heparin has been started for anticoagulation.

## 2020-09-22 NOTE — ED Notes (Signed)
Will wait 30 minutes to increase Cardizem per order parameters

## 2020-09-22 NOTE — ED Notes (Signed)
HR 86, BP 101/68. Cardizem decreased to 10mg 

## 2020-09-22 NOTE — ED Notes (Signed)
Patients HR in the 140s -160s. Patient not complaining of any chest pain/SOB. Secretary called to page provider.  EKG captured.

## 2020-09-22 NOTE — ED Notes (Addendum)
Provider made aware of patients rhythm. Will continue to monitor the patient and await orders at this time.

## 2020-09-22 NOTE — Progress Notes (Signed)
ANTICOAGULATION CONSULT NOTE - Initial Consult  Pharmacy Consult for Heparin Indication: pulmonary embolus  Allergies  Allergen Reactions  . Microplegia Msa-Msg [Plegisol]     SWELLING & NAUSEA     Patient Measurements: Height: 6\' 3"  (190.5 cm) Weight: 134.3 kg (296 lb) IBW/kg (Calculated) : 84.5 Heparin Dosing Weight: 113 kg  Vital Signs: Temp: 98.2 F (36.8 C) (02/03 1943) Temp Source: Oral (02/03 1943) BP: 148/78 (02/03 1943) Pulse Rate: 82 (02/03 1943)  Labs: Recent Labs    09/20/20 1010 09/20/20 1449 09/20/20 1713 09/20/20 2045 09/21/20 0204 09/21/20 0900 09/22/20 0242 09/22/20 1305 09/22/20 2031  HGB 18.6* 18.7*  --   --   --   --   --   --   --   HCT 59.6* 55.0*  --   --   --   --   --   --   --   PLT 296  --   --   --   --   --   --   --   --   HEPARINUNFRC  --   --   --   --   --   --   --  0.98* 1.06*  CREATININE 2.36*  --    < > 1.81* 1.63* 1.60*  --   --   --   TROPONINIHS  --   --   --   --   --   --  40*  --   --    < > = values in this interval not displayed.    Estimated Creatinine Clearance: 77.9 mL/min (A) (by C-G formula based on SCr of 1.6 mg/dL (H)).   Medical History: Past Medical History:  Diagnosis Date  . Complication of anesthesia    DURING DENTAL PROCEDURE  . Diabetes mellitus without complication (HCC)   . PONV (postoperative nausea and vomiting)   . Psoriasis     Medications:  No meds PTA  Assessment: 55 y.o. M presents with DKA. CT shows lobar and segmental acute pulmonary emboli on the right. To begin heparin. No AC PTA.  Hep lvl still high.     Goal of Therapy:  Heparin level 0.3-0.7 units/ml Monitor platelets by anticoagulation protocol: Yes   Plan:  Hold heparin x 1 hr Decrease heparin gtt to 1500 units/hr - @2300  Daily hep lvl  57, PharmD, BCPS, BCCCP Clinical Pharmacist [phone]  Please check AMION for all [facility] numbers  09/22/2020 9:43 PM

## 2020-09-22 NOTE — ED Notes (Signed)
Report given to Melissa RN

## 2020-09-22 NOTE — Progress Notes (Signed)
Admission from the ED by bed , awake and alert.

## 2020-09-22 NOTE — ED Notes (Signed)
HR 136, SBP 154, Cardizem increased to `15

## 2020-09-22 NOTE — ED Notes (Signed)
HR 136, SBP 115, Cardizem increased to 10mg  at this time

## 2020-09-22 NOTE — Progress Notes (Signed)
Inpatient Diabetes Program Recommendations  AACE/ADA: New Consensus Statement on Inpatient Glycemic Control (2015)  Target Ranges:  Prepandial:   less than 140 mg/dL      Peak postprandial:   less than 180 mg/dL (1-2 hours)      Critically ill patients:  140 - 180 mg/dL   Lab Results  Component Value Date   GLUCAP 285 (H) 09/22/2020   HGBA1C >15.5 (H) 09/20/2020    Review of Glycemic Control Results for Richard Mullins, Richard Mullins (MRN [id number]) as of 09/22/2020 14:58  Ref. Range 09/22/2020 03:03 09/22/2020 05:22 09/22/2020 07:10 09/22/2020 08:12 09/22/2020 11:12  Glucose-Capillary Latest Ref Range: 70 - 99 mg/dL 349 (H) 179 (H) 150 (H) 140 (H) 285 (H)   Diabetes history: DM 2- A1C>15% Outpatient Diabetes medications: None Current orders for Inpatient glycemic control:  Novolog resistant tid with meals and HS Novolog 8 units tid with meals Lantus 50 units daily  Inpatient Diabetes Program Recommendations:    Spoke with patient again regarding new diagnosis of DM.  Discussed A1C.  He said he was aware.  He did not have DM booklet at bedside so will re-order.  We discussed again what Type 2 diabetes is and potential causes.  He again states that he has been drinking lots of milk, juices, and soda.  We discussed balanced diet and reducing intake of sugar in beverages.  Discussed 70/30 insulin and gave him list of DM supplies that can be bought from [facility].  Sent him link regarding DM Pen use as well.  Asked about coming back to day to show him the insulin pen, and he requested to wait until tomorrow.  Gave patient glucometer/Relion for home as lack of insurance is likely going to be a barrier for him.  Will follow.   Thanks,  Beryl Meager, RN, BC-ADM Inpatient Diabetes Coordinator Pager [phone] (8a-5p)

## 2020-09-22 NOTE — ED Notes (Signed)
Endotool states to consider transition from insulin drip and notify provider. Provider paged.

## 2020-09-22 NOTE — Progress Notes (Signed)
PROGRESS NOTE  Richard Mullins  DPO:[id number] DOB: [birth year] DOA: 09/20/2020 PCP: None Brief Narrative: Richard Mullins is a 55 y.o. male with limited medical follow up who presented to the ED with several weeks of polyuria, polydipsia, mental clouding, blurry vision who was found to be in DKA with new diagnosis of T2DM. IV insulin and IVF were given with improved glycemic control and metabolic parameters, subsequently converted to basal-bolus insulin. HbA1c found to be >15.5%. Overnight into 2/3, pt developed AFib w/RVR and CTA chest performed revealing right-sided PE for which diltiazem and heparin gtt's were started. The patient is not hypoxemic.   Assessment & Plan: Principal Problem:   DKA, type 2 ([place]) Active Problems:   AKI (acute kidney injury) ([place])   Lactic acidosis   Hypernatremia   Hypercalcemia   Dehydration  DKA, lactic acidosis, new diagnosis T2DM: Symptoms of hyperglycemia dating several months and HbA1c >15.5% indicate average CBG > 433m/dl. - Ketones cleared, has required upwards of >150 units over past 24 hours. Will provide 50u lantus this AM, DC gtt 2 hrs afterward (d/w RN), start 8u mealtime insulin + resistant SSI to begin this AM. DC dextrose IVF. Anticipate need for more insulin than we're starting with, may need PM dosing of basal insulin as well beginning this evening. - Diabetes coordinator consulted. Pt has no insurance, will likely ultimately DC on 70/30 insulin. - Dietitian consulted  Acute PE:  - Continue heparin gtt > ultimately will need DOAC - Echocardiogram.  - Monitor respiratory status.   Atrial fibrillation with RVR: New diagnosis, provoked by PE.  - Continue anticoagulation - Echo - Start metoprolol tartrate now, DC diltiazem (currently 558mhr) gtt if remains in NSR over next hour. BB chosen due to mildly elevated troponin which may suggest CAD predisposing to demand myocardial ischemia.   Troponin elevation: Demand ischemia.  - Will warrant  cardiology evaluation nonemergently.  Acute metabolic encephalopathy: Due to severe hyperglycemia, improving slowly. - Supportive care. Education efforts may be more effective once this improves more.   AKI: Improving.  - Unclear baseline, will continue monitoring post contrast.  Hypernatremia, hypercalcemia: Improving slightly (when admission Na 149 corrected for glucose >1000, level was actually ~16361ml). Suspect ongoing hemoconcentration. - Monitoring.  LFT elevation: Monitor.  Psoriasis: Established diagnosis without severe symptoms currently. Pt requesting topical Tx.  - Triamcinolone prn  Obesity: Estimated body mass index is 37 kg/m as calculated from the following:   Height as of this encounter: 6' 3" (1.905 m).   Weight as of this encounter: 134.3 kg.  DVT prophylaxis: Heparin gtt Code Status: Full Family Communication: None at bedside Disposition Plan:  Status is: Inpatient  Remains inpatient appropriate because:Persistent severe electrolyte disturbances, Altered mental status, Ongoing diagnostic testing needed not appropriate for outpatient work up and Inpatient level of care appropriate due to severity of illness   Dispo: The patient is from: Home              Anticipated d/c is to: Home              Anticipated d/c date is: 2 days              Patient currently is not medically stable to d/c.   Difficult to place patient No  Consultants:   None  Procedures:   None  Antimicrobials:  None   Subjective: More alert, reports stabilized vision. No chest pain. No dyspnea. Reports intermittent palpitations for many months, but none last night. No bleeding.  Objective: Vitals:   09/22/20 0510 09/22/20 0524 09/22/20 0552 09/22/20 0645  BP: 117/75 99/67 106/68 117/65  Pulse: 84 91 81 93  Resp: 16 (!) 24 (!) 0 17  Temp:      TempSrc:      SpO2: 93% 93% 94% 96%  Weight:      Height:       No intake or output data in the 24 hours ending 09/22/20  0905 Filed Weights   09/20/20 1002  Weight: 134.3 kg   Gen: 55 y.o. male in no distress Pulm: Nonlabored breathing room air. Clear. CV: Regular rate and rhythm, vent rate in 80's. No murmur, rub, or gallop. No JVD, no dependent edema. GI: Abdomen soft, non-tender, non-distended, with normoactive bowel sounds.  Ext: Warm, no deformities Skin: Scattered small psoriatic plaques. No other rashes, lesions or ulcers on visualized skin. Neuro: Alert and oriented. No focal neurological deficits. Psych: Judgement and insight appear fair. Mood euthymic & affect congruent. Behavior is appropriate.    Data Reviewed: I have personally reviewed following labs and imaging studies  CBC: Recent Labs  Lab 09/20/20 1010 09/20/20 1449  WBC 12.5*  --   NEUTROABS 9.8*  --   HGB 18.6* 18.7*  HCT 59.6* 55.0*  MCV 100.0  --   PLT 296  --    Basic Metabolic Panel: Recent Labs  Lab 09/20/20 1010 09/20/20 1449 09/20/20 1713 09/20/20 2045 09/21/20 0204 09/21/20 0900 09/22/20 0242  NA 149* 152* 156* 156* 161* 161*  --   K 4.9 4.6 4.1 3.6 3.7 3.4* 3.3*  CL 101  --  114* 119* 122* 122*  --   CO2 17*  --  19* _0 --   GLUCOSE 1,004*  --  734* 462* 242* 292*  --   BUN 42*  --  43* 42* 38* 37*  --   CREATININE 2.36*  --  2.27* 1.81* 1.63* 1.60*  --   CALCIUM 11.4*  --  11.2* 10.6* 10.9* 10.3  --   MG  --   --   --   --   --   --  2.4   GFR: Estimated Creatinine Clearance: 77.9 mL/min (A) (by C-G formula based on SCr of 1.6 mg/dL (H)). Liver Function Tests: Recent Labs  Lab 09/20/20 1010  AST 32  ALT 54*  ALKPHOS 65  BILITOT 2.2*  PROT 8.5*  ALBUMIN 4.0   Recent Labs  Lab 09/20/20 1010  LIPASE 48   No results for input(s): AMMONIA in the last 168 hours. Coagulation Profile: No results for input(s): INR, PROTIME in the last 168 hours. Cardiac Enzymes: No results for input(s): CKTOTAL, CKMB, CKMBINDEX, TROPONINI in the last 168 hours. BNP (last 3 results) No results for  input(s): PROBNP in the last 8760 hours. HbA1C: Recent Labs    09/20/20 1713  HGBA1C >15.5*   CBG: Recent Labs  Lab 09/22/20 0044 09/22/20 0303 09/22/20 0522 09/22/20 0710 09/22/20 0812  GLUCAP 174* 163* 195* 166* 140*   Lipid Profile: No results for input(s): CHOL, HDL, LDLCALC, TRIG, CHOLHDL, LDLDIRECT in the last 72 hours. Thyroid Function Tests: Recent Labs    09/22/20 0243  TSH 0.250*   Anemia Panel: No results for input(s): VITAMINB12, FOLATE, FERRITIN, TIBC, IRON, RETICCTPCT in the last 72 hours. Urine analysis:    Component Value Date/Time   COLORURINE YELLOW 09/20/2020 [place] 09/20/2020 1603   LABSPEC 1.015 09/20/2020 1603   PHURINE 5.5 09/20/2020 1603  GLUCOSEU >=500 (A) 09/20/2020 1603   HGBUR SMALL (A) 09/20/2020 1603   BILIRUBINUR NEGATIVE 09/20/2020 1603   KETONESUR 15 (A) 09/20/2020 1603   PROTEINUR NEGATIVE 09/20/2020 1603   NITRITE NEGATIVE 09/20/2020 1603   LEUKOCYTESUR NEGATIVE 09/20/2020 1603   Recent Results (from the past 240 hour(s))  SARS CORONAVIRUS 2 (TAT 6-24 HRS) Nasopharyngeal Nasopharyngeal Swab     Status: None   Collection Time: 09/20/20  3:20 PM   Specimen: Nasopharyngeal Swab  Result Value Ref Range Status   SARS Coronavirus 2 NEGATIVE NEGATIVE Final    Comment: (NOTE) SARS-CoV-2 target nucleic acids are NOT DETECTED.  The SARS-CoV-2 RNA is generally detectable in upper and lower respiratory specimens during the acute phase of infection. Negative results do not preclude SARS-CoV-2 infection, do not rule out co-infections with other pathogens, and should not be used as the sole basis for treatment or other patient management decisions. Negative results must be combined with clinical observations, patient history, and epidemiological information. The expected result is Negative.  Fact Sheet for Patients: SugarRoll.be  Fact Sheet for Healthcare  Providers: https://www.woods-mathews.com/  This test is not yet approved or cleared by the [place] FDA and  has been authorized for detection and/or diagnosis of SARS-CoV-2 by FDA under an Emergency Use Authorization (EUA). This EUA will remain  in effect (meaning this test can be used) for the duration of the COVID-19 declaration under Se ction 564(b)(1) of the Act, 21 U.S.C. section 360bbb-3(b)(1), unless the authorization is terminated or revoked sooner.  Performed at [facility], [place] [address] [place], [place] [address]       Radiology Studies: CT ANGIO CHEST PE W OR WO CONTRAST  Result Date: 09/22/2020 CLINICAL DATA:  Increased heart rate and shortness of breath. Recent cardioversion. COVID positive EXAM: CT ANGIOGRAPHY CHEST WITH CONTRAST TECHNIQUE: Multidetector CT imaging of the chest was performed using the standard protocol during bolus administration of intravenous contrast. Multiplanar CT image reconstructions and MIPs were obtained to evaluate the vascular anatomy. CONTRAST:  123m OMNIPAQUE IOHEXOL 350 MG/ML SOLN COMPARISON:  None. FINDINGS: Cardiovascular: Branching pulmonary artery filling defects within anterior basal segment right lower lobe and branching in the right upper lobe pulmonary artery. The clot burden is smaller than would be expected to cause right heart dilatation. No cardiomegaly or pericardial effusion. Unremarkable aorta Mediastinum/Nodes: Negative for adenopathy or mass. Lungs/Pleura: Central airways are clear. No failure or lung infarct. Mild atelectasis in the left lower lung. Upper Abdomen: Hepatic steatosis. Punctate left upper pole calculus. Musculoskeletal: No acute or aggressive finding. Review of the MIP images confirms the above findings. Critical Value/emergent results were called by telephone at the time of interpretation on 09/22/2020 at 5:46 am to [facility], who verbally acknowledged these results. IMPRESSION:  1. Positive for lobar and segmental acute pulmonary emboli on the right. 2. Hepatic steatosis. Electronically Signed   By: JMonte FantasiaM.D.   On: 09/22/2020 05:47   DG CHEST PORT 1 VIEW  Result Date: 09/22/2020 CLINICAL DATA:  Atrial fibrillation and shortness of breath EXAM: PORTABLE CHEST 1 VIEW COMPARISON:  09/20/2020 FINDINGS: Cardiac shadow is within normal limits. The lungs are well aerated bilaterally. No focal infiltrate or sizable effusion is seen. No bony abnormality is noted. IMPRESSION: No acute abnormality noted. Electronically Signed   By: MInez CatalinaM.D.   On: 09/22/2020 03:19   DG Chest Portable 1 View  Result Date: 09/20/2020 CLINICAL DATA:  Shortness of breath EXAM: PORTABLE CHEST  1 VIEW COMPARISON:  None. FINDINGS: Low lung volumes. No consolidation or edema. No pleural effusion or pneumothorax. Cardiomediastinal contours are within normal limits. IMPRESSION: No acute process in the chest. Electronically Signed   By: Macy Mis M.D.   On: 09/20/2020 14:10   DG Abd Portable 1V  Result Date: 09/20/2020 CLINICAL DATA:  Intractable nausea and vomiting. EXAM: PORTABLE ABDOMEN - 1 VIEW COMPARISON:  None. FINDINGS: The bowel gas pattern is normal. No radio-opaque calculi or other significant radiographic abnormality are seen. IMPRESSION: Negative. Electronically Signed   By: Constance Holster M.D.   On: 09/20/2020 16:50    Scheduled Meds: . insulin aspart  0-20 Units Subcutaneous TID WC  . insulin aspart  0-5 Units Subcutaneous QHS  . insulin aspart  8 Units Subcutaneous TID WC  . insulin glargine  50 Units Subcutaneous Daily  . insulin starter kit- pen needles  1 kit Other Once  . lactulose  20 g Oral TID  . metoprolol tartrate  25 mg Oral BID  . pantoprazole  40 mg Oral Daily  . polyethylene glycol  17 g Oral Daily  . senna-docusate  2 tablet Oral BID   Continuous Infusions: . dextrose 5 % and 0.45% NaCl Stopped (09/22/20 0535)  . heparin 1,900 Units/hr (09/22/20  0632)     LOS: 2 days   Time spent: 35 minutes.  Patrecia Pour, MD [facility] [email or web address] 09/22/2020, 9:05 AM

## 2020-09-22 NOTE — Progress Notes (Signed)
ANTICOAGULATION CONSULT NOTE - Initial Consult  Pharmacy Consult for Heparin Indication: pulmonary embolus  No Known Allergies  Patient Measurements: Height: 6\' 3"  (190.5 cm) Weight: 134.3 kg (296 lb) IBW/kg (Calculated) : 84.5 Heparin Dosing Weight: 113 kg  Vital Signs: BP: 131/76 (02/03 1230) Pulse Rate: 78 (02/03 1230)  Labs: Recent Labs    09/20/20 1010 09/20/20 1449 09/20/20 1713 09/20/20 2045 09/21/20 0204 09/21/20 0900 09/22/20 0242 09/22/20 1305  HGB 18.6* 18.7*  --   --   --   --   --   --   HCT 59.6* 55.0*  --   --   --   --   --   --   PLT 296  --   --   --   --   --   --   --   HEPARINUNFRC  --   --   --   --   --   --   --  0.98*  CREATININE 2.36*  --    < > 1.81* 1.63* 1.60*  --   --   TROPONINIHS  --   --   --   --   --   --  40*  --    < > = values in this interval not displayed.    Estimated Creatinine Clearance: 77.9 mL/min (A) (by C-G formula based on SCr of 1.6 mg/dL (H)).   Medical History: Past Medical History:  Diagnosis Date  . Psoriasis     Medications:  No meds PTA  Assessment: 55 y.o. M presents with DKA. CT shows lobar and segmental acute pulmonary emboli on the right. To begin heparin. No AC PTA.  2/3: 6 hour heparin level slightly supratherapeutic at 0.96.  Will decrease heparin gtt slightly (~2 units/kg/hr) to keep therapeutic.     Goal of Therapy:  Heparin level 0.3-0.7 units/ml Monitor platelets by anticoagulation protocol: Yes   Plan:  Decrease heparin gtt to 1700 units/hr Will f/u heparin level in 6 hours  57, PharmD PGY-1 Acute Care Pharmacy Resident Office: [phone] 09/22/2020 2:50 PM

## 2020-09-22 NOTE — ED Notes (Signed)
Cardizem decreased back to 10mg . Pt sitting up in bed asking questions and participating in care.

## 2020-09-22 NOTE — ED Notes (Signed)
Patient transported to CT on monitor with RN

## 2020-09-22 NOTE — ED Notes (Signed)
Paged Dr Loney Loh to Dr Grace Isaac

## 2020-09-22 NOTE — ED Notes (Signed)
Paged attending for RN 

## 2020-09-22 NOTE — Progress Notes (Signed)
ANTICOAGULATION CONSULT NOTE - Initial Consult  Pharmacy Consult for Heparin Indication: pulmonary embolus  No Known Allergies  Patient Measurements: Height: 6\' 3"  (190.5 cm) Weight: 134.3 kg (296 lb) IBW/kg (Calculated) : 84.5 Heparin Dosing Weight: 113 kg  Vital Signs: BP: 106/68 (02/03 0552) Pulse Rate: 81 (02/03 0552)  Labs: Recent Labs    09/20/20 1010 09/20/20 1449 09/20/20 1713 09/20/20 2045 09/21/20 0204 09/21/20 0900  HGB 18.6* 18.7*  --   --   --   --   HCT 59.6* 55.0*  --   --   --   --   PLT 296  --   --   --   --   --   CREATININE 2.36*  --    < > 1.81* 1.63* 1.60*   < > = values in this interval not displayed.    Estimated Creatinine Clearance: 77.9 mL/min (A) (by C-G formula based on SCr of 1.6 mg/dL (H)).   Medical History: Past Medical History:  Diagnosis Date  . Psoriasis     Medications:  No meds PTA  Assessment: 55 y.o. M presents with DKA. CT shows lobar and segmental acute pulmonary emboli on the right. To begin heparin. No AC PTA. Pt received SQ heparin 5000 units ~0500. CBC ok on admission.  Goal of Therapy:  Heparin level 0.3-0.7 units/ml Monitor platelets by anticoagulation protocol: Yes   Plan:  D/c SQ heparin Heparin IV bolus 3000 units (half bolus due to proximity of SQ dose) Heparin gtt at 1900 units/hr Will f/u heparin level in 6 hours Daily heparin level and CBC  57, PharmD, BCPS Please see amion for complete clinical pharmacist phone list 09/22/2020,5:59 AM

## 2020-09-22 NOTE — Progress Notes (Signed)
  Echocardiogram 2D Echocardiogram has been performed.  Tye Savoy 09/22/2020, 10:27 AM

## 2020-09-22 NOTE — ED Notes (Signed)
MD notified of 4.14 D-dimer

## 2020-09-22 NOTE — ED Notes (Signed)
Breakfast ordered 

## 2020-09-23 LAB — CBC
HCT: 42.1 % (ref 39.0–52.0)
Hemoglobin: 14.6 g/dL (ref 13.0–17.0)
MCH: 33.2 pg (ref 26.0–34.0)
MCHC: 34.7 g/dL (ref 30.0–36.0)
MCV: 95.7 fL (ref 80.0–100.0)
Platelets: 110 10*3/uL — ABNORMAL LOW (ref 150–400)
RBC: 4.4 MIL/uL (ref 4.22–5.81)
RDW: 12.9 % (ref 11.5–15.5)
WBC: 10.5 10*3/uL (ref 4.0–10.5)
nRBC: 0.6 % — ABNORMAL HIGH (ref 0.0–0.2)

## 2020-09-23 LAB — GLUCOSE, CAPILLARY
Glucose-Capillary: 280 mg/dL — ABNORMAL HIGH (ref 70–99)
Glucose-Capillary: 245 mg/dL — ABNORMAL HIGH (ref 70–99)
Glucose-Capillary: 287 mg/dL — ABNORMAL HIGH (ref 70–99)
Glucose-Capillary: 180 mg/dL — ABNORMAL HIGH (ref 70–99)

## 2020-09-23 LAB — BASIC METABOLIC PANEL
Anion gap: 11 (ref 5–15)
BUN: 19 mg/dL (ref 6–20)
CO2: 21 mmol/L — ABNORMAL LOW (ref 22–32)
Calcium: 8.8 mg/dL — ABNORMAL LOW (ref 8.9–10.3)
Chloride: 110 mmol/L (ref 98–111)
Creatinine, Ser: 0.92 mg/dL (ref 0.61–1.24)
GFR, Estimated: >60 mL/min (ref >60)
Glucose, Bld: 312 mg/dL — ABNORMAL HIGH (ref 70–99)
Potassium: 3.6 mmol/L (ref 3.5–5.1)
Sodium: 142 mmol/L (ref 135–145)

## 2020-09-23 LAB — MAGNESIUM: Magnesium: 2.1 mg/dL (ref 1.7–2.4)

## 2020-09-23 LAB — T3, FREE: T3, Free: 2.6 pg/mL (ref 2.0–4.4)

## 2020-09-23 LAB — HEPARIN LEVEL (UNFRACTIONATED)
Heparin Unfractionated: 0.68 IU/mL (ref 0.30–0.70)
Heparin Unfractionated: 0.63 IU/mL (ref 0.30–0.70)

## 2020-09-23 MED ORDER — APIXABAN 5 MG PO TABS: 5.0000 mg | ORAL_TABLET | Freq: Two times a day (BID) | ORAL | Status: DC

## 2020-09-23 MED ORDER — INSULIN GLARGINE 100 UNIT/ML ~~LOC~~ SOLN
60.0000 [IU] | Freq: Every day | SUBCUTANEOUS | Status: DC
  Administered 2020-09-24 – 2020-09-26 (×3): 60 [IU] via SUBCUTANEOUS
  Filled 2020-09-23 (×3): qty 0.6

## 2020-09-23 MED ORDER — INSULIN ASPART 100 UNIT/ML ~~LOC~~ SOLN
12.0000 [IU] | Freq: Three times a day (TID) | SUBCUTANEOUS | Status: DC
  Administered 2020-09-23 – 2020-09-26 (×9): 12 [IU] via SUBCUTANEOUS

## 2020-09-23 MED ORDER — METOPROLOL TARTRATE 50 MG PO TABS
50.0000 mg | ORAL_TABLET | Freq: Two times a day (BID) | ORAL | Status: DC
  Administered 2020-09-23 – 2020-09-24 (×2): 50 mg via ORAL
  Filled 2020-09-23 (×2): qty 1

## 2020-09-23 MED ORDER — INSULIN GLARGINE 100 UNIT/ML ~~LOC~~ SOLN
10.0000 [IU] | Freq: Once | SUBCUTANEOUS | Status: AC
  Administered 2020-09-23: 10 [IU] via SUBCUTANEOUS
  Filled 2020-09-23: qty 0.1

## 2020-09-23 MED ORDER — APIXABAN 5 MG PO TABS
10.0000 mg | ORAL_TABLET | Freq: Two times a day (BID) | ORAL | Status: DC
  Administered 2020-09-23 – 2020-09-26 (×7): 10 mg via ORAL
  Filled 2020-09-23 (×7): qty 2

## 2020-09-23 NOTE — Progress Notes (Signed)
Initial insulin injection education given to pt. Very eager to learn and managed to give injection to his abd. with good technique

## 2020-09-23 NOTE — Progress Notes (Signed)
PROGRESS NOTE  Richard Mullins  QZR:[id number] DOB: 11-Feb-1966 DOA: 09/20/2020 PCP: None Brief Narrative: Richard Mullins is a 55 y.o. male with limited medical follow up who presented to the ED with several weeks of polyuria, polydipsia, mental clouding, blurry vision who was found to be in DKA with new diagnosis of T2DM. IV insulin and IVF were given with improved glycemic control and metabolic parameters, subsequently converted to basal-bolus insulin. HbA1c found to be >15.5%. Overnight into 2/3, pt developed AFib w/RVR and CTA chest performed revealing right-sided PE for which diltiazem and heparin gtt's were started. The patient is not hypoxemic.   Assessment & Plan: Principal Problem:   DKA, type 2 ([place]) Active Problems:   AKI (acute kidney injury) ([place])   Lactic acidosis   Hypernatremia   Hypercalcemia   Dehydration  DKA, lactic acidosis, new diagnosis T2DM: Symptoms of hyperglycemia dating several months and HbA1c >15.5% indicate average CBG > 471m/dl. - Add lantus 10u now with goal of 60u daily dosing.  - Increase mealtime to 12u, continue resistant SSI and HS coverage.  - Diabetes coordinator consulted. Pt has no insurance, will likely ultimately DC on 70/30 insulin. - Dietitian consulted, care order for insulin administration education in preparation for discharge 2/5.  Acute PE: No RV strain on echo. - Convert heparin gtt to [place] CM consulted for this and PCP for ongoing management.  Atrial fibrillation with RVR: New diagnosis. - Continue anticoagulation - Continue metoprolol tartrate, increase dose. BB chosen due to mildly elevated troponin which may suggest CAD predisposing to demand myocardial ischemia.   Troponin elevation: Demand ischemia.  - Will warrant cardiology evaluation nonemergently.  Acute metabolic encephalopathy: Due to severe hyperglycemia, improving slowly. - Supportive care. Education efforts should be high yield now.  AKI: Improved  - Unclear  baseline, will continue monitoring post contrast.  Hypernatremia, hypercalcemia: Improving slightly (when admission Na 149 corrected for glucose >1000, level was actually ~1654mdl). Suspect ongoing hemoconcentration. - Resolved.  LFT elevation: Monitor.  Psoriasis: Established diagnosis without severe symptoms currently. Pt requesting topical Tx.  - Triamcinolone prn  Obesity: Estimated body mass index is 37 kg/m as calculated from the following:   Height as of this encounter: 6' 3" (1.905 m).   Weight as of this encounter: 134.3 kg.  DVT prophylaxis: Heparin gtt > eliquis Code Status: Full Family Communication: None at bedside Disposition Plan:  Status is: Inpatient  Remains inpatient appropriate because:Persistent severe electrolyte disturbances, Altered mental status, Ongoing diagnostic testing needed not appropriate for outpatient work up and Inpatient level of care appropriate due to severity of illness   Dispo: The patient is from: Home              Anticipated d/c is to: Home              Anticipated d/c date is: 1 day              Patient currently is not medically stable to d/c.   Difficult to place patient No  Consultants:   None  Procedures:   None  Antimicrobials:  None   Subjective: Only today feels back to his mental baseline, has been foggy headed. No palpitations, chest pain, dyspnea, leg swelling, orthopnea, or recent weight loss.   Objective: Vitals:   09/22/20 2255 09/23/20 0350 09/23/20 0728 09/23/20 1232  BP: 136/79 137/82    Pulse: 77 69 69 69  Resp: 14 18    Temp: 97.9 F (36.6 C) 97.9 F (36.6 C) 98.3  F (36.8 C) 98 F (36.7 C)  TempSrc: Oral Oral Oral Oral  SpO2: 97% 98% 98% 98%  Weight:      Height:        Intake/Output Summary (Last 24 hours) at 09/23/2020 1411 Last data filed at 09/23/2020 1400 Gross per 24 hour  Intake 802.67 ml  Output 1050 ml  Net -247.33 ml   Filed Weights   09/20/20 1002  Weight: 134.3 kg   Gen: 55  y.o. male in no distress Pulm: Nonlabored breathing room air. Clear. CV: Regular rate and rhythm. No murmur, rub, or gallop. No JVD, no dependent edema. GI: Abdomen soft, non-tender, non-distended, with normoactive bowel sounds.  Ext: Warm, no deformities Skin: No rashes, lesions or ulcers on visualized skin. Neuro: Alert and oriented. No focal neurological deficits. Psych: Judgement and insight appear fair. Mood euthymic & affect congruent. Behavior is appropriate.    Data Reviewed: I have personally reviewed following labs and imaging studies  CBC: Recent Labs  Lab 09/20/20 1010 09/20/20 1449 09/23/20 0109  WBC 12.5*  --  10.5  NEUTROABS 9.8*  --   --   HGB 18.6* 18.7* 14.6  HCT 59.6* 55.0* 42.1  MCV 100.0  --  95.7  PLT 296  --  161*   Basic Metabolic Panel: Recent Labs  Lab 09/20/20 1713 09/20/20 2045 09/21/20 0204 09/21/20 0900 09/22/20 0242 09/23/20 1108  NA 156* 156* 161* 161*  --  142  K 4.1 3.6 3.7 3.4* 3.3* 3.6  CL 114* 119* 122* 122*  --  110  CO2 19* _0 --  21*  GLUCOSE 734* 462* 242* 292*  --  312*  BUN 43* 42* 38* 37*  --  19  CREATININE 2.27* 1.81* 1.63* 1.60*  --  0.92  CALCIUM 11.2* 10.6* 10.9* 10.3  --  8.8*  MG  --   --   --   --  2.4 2.1   GFR: Estimated Creatinine Clearance: 135.5 mL/min (by C-G formula based on SCr of 0.92 mg/dL). Liver Function Tests: Recent Labs  Lab 09/20/20 1010  AST 32  ALT 54*  ALKPHOS 65  BILITOT 2.2*  PROT 8.5*  ALBUMIN 4.0   Recent Labs  Lab 09/20/20 1010  LIPASE 48   No results for input(s): AMMONIA in the last 168 hours. Coagulation Profile: No results for input(s): INR, PROTIME in the last 168 hours. Cardiac Enzymes: No results for input(s): CKTOTAL, CKMB, CKMBINDEX, TROPONINI in the last 168 hours. BNP (last 3 results) No results for input(s): PROBNP in the last 8760 hours. HbA1C: Recent Labs    09/20/20 1713  HGBA1C >15.5*   CBG: Recent Labs  Lab 09/22/20 1112 09/22/20 1711  09/22/20 2115 09/23/20 0633 09/23/20 1153  GLUCAP 285* 275* 325* 280* 245*   Lipid Profile: No results for input(s): CHOL, HDL, LDLCALC, TRIG, CHOLHDL, LDLDIRECT in the last 72 hours. Thyroid Function Tests: Recent Labs    09/22/20 0243 09/22/20 1706  TSH 0.250*  --   FREET4  --  1.13*  T3FREE  --  2.6   Anemia Panel: No results for input(s): VITAMINB12, FOLATE, FERRITIN, TIBC, IRON, RETICCTPCT in the last 72 hours. Urine analysis:    Component Value Date/Time   COLORURINE YELLOW 09/20/2020 [place] 09/20/2020 1603   LABSPEC 1.015 09/20/2020 1603   PHURINE 5.5 09/20/2020 1603   GLUCOSEU >=500 (A) 09/20/2020 1603   HGBUR SMALL (A) 09/20/2020 1603   BILIRUBINUR NEGATIVE 09/20/2020 1603  KETONESUR 15 (A) 09/20/2020 1603   PROTEINUR NEGATIVE 09/20/2020 1603   NITRITE NEGATIVE 09/20/2020 1603   LEUKOCYTESUR NEGATIVE 09/20/2020 1603   Recent Results (from the past 240 hour(s))  SARS CORONAVIRUS 2 (TAT 6-24 HRS) Nasopharyngeal Nasopharyngeal Swab     Status: None   Collection Time: 09/20/20  3:20 PM   Specimen: Nasopharyngeal Swab  Result Value Ref Range Status   SARS Coronavirus 2 NEGATIVE NEGATIVE Final    Comment: (NOTE) SARS-CoV-2 target nucleic acids are NOT DETECTED.  The SARS-CoV-2 RNA is generally detectable in upper and lower respiratory specimens during the acute phase of infection. Negative results do not preclude SARS-CoV-2 infection, do not rule out co-infections with other pathogens, and should not be used as the sole basis for treatment or other patient management decisions. Negative results must be combined with clinical observations, patient history, and epidemiological information. The expected result is Negative.  Fact Sheet for Patients: SugarRoll.be  Fact Sheet for Healthcare Providers: https://www.woods-mathews.com/  This test is not yet approved or cleared by the [place] FDA and   has been authorized for detection and/or diagnosis of SARS-CoV-2 by FDA under an Emergency Use Authorization (EUA). This EUA will remain  in effect (meaning this test can be used) for the duration of the COVID-19 declaration under Se ction 564(b)(1) of the Act, 21 U.S.C. section 360bbb-3(b)(1), unless the authorization is terminated or revoked sooner.  Performed at [facility], [place] [address] [place], [place] [address]   MRSA PCR Screening     Status: None   Collection Time: 09/22/20  6:03 PM   Specimen: Nasopharyngeal  Result Value Ref Range Status   MRSA by PCR NEGATIVE NEGATIVE Final    Comment:        The GeneXpert MRSA Assay (FDA approved for NASAL specimens only), is one component of a comprehensive MRSA colonization surveillance program. It is not intended to diagnose MRSA infection nor to guide or monitor treatment for MRSA infections. Performed at [facility], [place] [address] [place], [place] [address]       Radiology Studies: CT ANGIO CHEST PE W OR WO CONTRAST  Result Date: 09/22/2020 CLINICAL DATA:  Increased heart rate and shortness of breath. Recent cardioversion. COVID positive EXAM: CT ANGIOGRAPHY CHEST WITH CONTRAST TECHNIQUE: Multidetector CT imaging of the chest was performed using the standard protocol during bolus administration of intravenous contrast. Multiplanar CT image reconstructions and MIPs were obtained to evaluate the vascular anatomy. CONTRAST:  132m OMNIPAQUE IOHEXOL 350 MG/ML SOLN COMPARISON:  None. FINDINGS: Cardiovascular: Branching pulmonary artery filling defects within anterior basal segment right lower lobe and branching in the right upper lobe pulmonary artery. The clot burden is smaller than would be expected to cause right heart dilatation. No cardiomegaly or pericardial effusion. Unremarkable aorta Mediastinum/Nodes: Negative for adenopathy or mass. Lungs/Pleura: Central airways are clear. No failure or lung infarct. Mild  atelectasis in the left lower lung. Upper Abdomen: Hepatic steatosis. Punctate left upper pole calculus. Musculoskeletal: No acute or aggressive finding. Review of the MIP images confirms the above findings. Critical Value/emergent results were called by telephone at the time of interpretation on 09/22/2020 at 5:46 am [facility] who verbally acknowledged these results. IMPRESSION: 1. Positive for lobar and segmental acute pulmonary emboli on the right. 2. Hepatic steatosis. Electronically Signed   By: JMonte FantasiaM.D.   On: 09/22/2020 05:47   DG CHEST PORT 1 VIEW  Result Date: 09/22/2020 CLINICAL DATA:  Atrial  fibrillation and shortness of breath EXAM: PORTABLE CHEST 1 VIEW COMPARISON:  09/20/2020 FINDINGS: Cardiac shadow is within normal limits. The lungs are well aerated bilaterally. No focal infiltrate or sizable effusion is seen. No bony abnormality is noted. IMPRESSION: No acute abnormality noted. Electronically Signed   By: Inez Catalina M.D.   On: 09/22/2020 03:19   ECHOCARDIOGRAM COMPLETE  Result Date: 09/22/2020    ECHOCARDIOGRAM REPORT   Patient Name:   Richard Mullins Date of Exam: 09/22/2020 Medical Rec #:  [id number]      Height:       75.0 in Accession #:    [id number]     Weight:       296.0 lb Date of Birth:  13-Jan-1966      BSA:          2.592 m Patient Age:    56 years       BP:           121/68 mmHg Patient Gender: M              HR:           81 bpm. Exam Location:  Inpatient Procedure: 2D Echo and Intracardiac Opacification Agent Indications:    Atrial Fibrillation I48.91  History:        Patient has no prior history of Echocardiogram examinations.  Sonographer:    Mikki Santee RDCS (AE) Referring Phys: [id number] [place]  1. Poor acoustic windows limit study.  2. Left ventricular ejection fraction, by estimation, is 65 to 70%. The left ventricle has normal function. The left ventricle has no regional wall motion abnormalities. There is not  assessed left ventricular hypertrophy. Left ventricular diastolic parameters are consistent with Grade I diastolic dysfunction (impaired relaxation).  3. Right ventricular systolic function is normal. The right ventricular size is normal.  4. Left atrial size was mildly dilated.  5. The mitral valve is normal in structure. Trivial mitral valve regurgitation.  6. The aortic valve is normal in structure. Aortic valve regurgitation is not visualized.  7. Pulmonic valve regurgitation not assessed.  8. The inferior vena cava normal size. FINDINGS  Left Ventricle: Left ventricular ejection fraction, by estimation, is 65 to 70%. The left ventricle has normal function. The left ventricle has no regional wall motion abnormalities. The left ventricular internal cavity size was normal in size. There is  not assessed left ventricular hypertrophy. Left ventricular diastolic parameters are consistent with Grade I diastolic dysfunction (impaired relaxation). Right Ventricle: The right ventricular size is normal. Right vetricular wall thickness was not assessed. Right ventricular systolic function is normal. Left Atrium: Left atrial size was mildly dilated. Right Atrium: Right atrial size was normal in size. Pericardium: There is no evidence of pericardial effusion. Mitral Valve: The mitral valve is normal in structure. Trivial mitral valve regurgitation. Tricuspid Valve: The tricuspid valve is normal in structure. Tricuspid valve regurgitation is trivial. Aortic Valve: The aortic valve is normal in structure. Aortic valve regurgitation is not visualized. Pulmonic Valve: The pulmonic valve was not well visualized. Pulmonic valve regurgitation not assessed. Aorta: The aortic root is normal in size and structure. Venous: The inferior vena cava normal size. IAS/Shunts: The interatrial septum was not assessed.   Diastology LV e' medial:    6.85 cm/s LV E/e' medial:  8.7 LV e' lateral:   8.38 cm/s LV E/e' lateral: 7.1  RIGHT VENTRICLE  RV S prime:     19.60 cm/s TAPSE (M-mode): 2.3 cm LEFT ATRIUM  Index       RIGHT ATRIUM           Index LA diam:        3.10 cm  1.20 cm/m  RA Area:     14.00 cm LA Vol (A2C):   82.8 ml  31.94 ml/m RA Volume:   32.20 ml  12.42 ml/m LA Vol (A4C):   105.0 ml 40.51 ml/m LA Biplane Vol: 100.0 ml 38.58 ml/m  AORTIC VALVE LVOT Vmax:   107.00 cm/s LVOT Vmean:  63.200 cm/s LVOT VTI:    0.224 m MITRAL VALVE MV Area (PHT): 3.17 cm    SHUNTS MV Decel Time: 239 msec    Systemic VTI: 0.22 m MV E velocity: 59.60 cm/s MV A velocity: 51.00 cm/s MV E/A ratio:  1.17 Dorris Carnes MD Electronically signed by Dorris Carnes MD Signature Date/Time: 09/22/2020/1:45:47 PM    Final     Scheduled Meds: . insulin aspart  0-20 Units Subcutaneous TID WC  . insulin aspart  0-5 Units Subcutaneous QHS  . insulin aspart  12 Units Subcutaneous TID WC  . insulin glargine  10 Units Subcutaneous Once  . [START ON 09/24/2020] insulin glargine  60 Units Subcutaneous Daily  . insulin starter kit- pen needles  1 kit Other Once  . lactulose  20 g Oral TID  . living well with diabetes book   Does not apply Once  . metoprolol tartrate  25 mg Oral BID  . pantoprazole  40 mg Oral Daily  . polyethylene glycol  17 g Oral Daily  . senna-docusate  2 tablet Oral BID  . triamcinolone   Topical TID   Continuous Infusions: . heparin 1,500 Units/hr (09/23/20 1055)     LOS: 3 days   Time spent: 35 minutes.  Patrecia Pour, MD [facility] [email or web address] 09/23/2020, 2:11 PM

## 2020-09-23 NOTE — Progress Notes (Addendum)
ANTICOAGULATION CONSULT NOTE  Pharmacy Consult for Heparin Indication: pulmonary embolus  Allergies  Allergen Reactions  . Microplegia Msa-Msg [Plegisol]     SWELLING & NAUSEA     Patient Measurements: Height: 6\' 3"  (190.5 cm) Weight: 134.3 kg (296 lb) IBW/kg (Calculated) : 84.5 Heparin Dosing Weight: 113 kg  Vital Signs: Temp: 98.3 F (36.8 C) (02/04 0728) Temp Source: Oral (02/04 0728) BP: 137/82 (02/04 0350) Pulse Rate: 69 (02/04 0728)  Labs: Recent Labs    09/20/20 1449 09/20/20 1713 09/20/20 2045 09/21/20 0204 09/21/20 0900 09/22/20 0242 09/22/20 1305 09/22/20 2031 09/23/20 0109 09/23/20 0854  HGB 18.7*  --   --   --   --   --   --   --  14.6  --   HCT 55.0*  --   --   --   --   --   --   --  42.1  --   PLT  --   --   --   --   --   --   --   --  110*  --   HEPARINUNFRC  --   --   --   --   --   --    < > 1.06* 0.68 0.63  CREATININE  --    < > 1.81* 1.63* 1.60*  --   --   --   --   --   TROPONINIHS  --   --   --   --   --  40*  --   --   --   --    < > = values in this interval not displayed.    Estimated Creatinine Clearance: 77.9 mL/min (A) (by C-G formula based on SCr of 1.6 mg/dL (H)).  Assessment: 55 y.o. M presents with DKA. CT shows lobar and segmental acute pulmonary emboli on the right. To begin heparin. No AC PTA.  Heparin level therapeutic, H/H wnl, pltc down but all cell lines reduced significantly likely due to DKA fluids.  Goal of Therapy:  Heparin level 0.3-0.7 units/ml Monitor platelets by anticoagulation protocol: Yes   Plan:  Continue heparin gtt at 1500 units/hr  Follow pltc closely tomorrow  ADDENDUM: Pharmacy to switch to apixaban, AKI resolved, no DDIs.  Plan: -Stop heparin -Apixaban 10mg  BID x7 days then 5mg  BID   57, PharmD, BCPS, [facility] Clinical Pharmacist [phone] Please check AMION for all [facility] Pharmacy numbers 09/23/2020

## 2020-09-23 NOTE — Progress Notes (Signed)
ANTICOAGULATION CONSULT NOTE  Pharmacy Consult for Heparin Indication: pulmonary embolus  Allergies  Allergen Reactions  . Microplegia Msa-Msg [Plegisol]     SWELLING & NAUSEA     Patient Measurements: Height: 6\' 3"  (190.5 cm) Weight: 134.3 kg (296 lb) IBW/kg (Calculated) : 84.5 Heparin Dosing Weight: 113 kg  Vital Signs: Temp: 97.9 F (36.6 C) (02/03 2255) Temp Source: Oral (02/03 2255) BP: 136/79 (02/03 2255) Pulse Rate: 77 (02/03 2255)  Labs: Recent Labs    09/20/20 1010 09/20/20 1449 09/20/20 1713 09/20/20 2045 09/21/20 0204 09/21/20 0900 09/22/20 0242 09/22/20 1305 09/22/20 2031 09/23/20 0109  HGB 18.6* 18.7*  --   --   --   --   --   --   --  14.6  HCT 59.6* 55.0*  --   --   --   --   --   --   --  42.1  PLT 296  --   --   --   --   --   --   --   --  110*  HEPARINUNFRC  --   --   --   --   --   --   --  0.98* 1.06* 0.68  CREATININE 2.36*  --    < > 1.81* 1.63* 1.60*  --   --   --   --   TROPONINIHS  --   --   --   --   --   --  40*  --   --   --    < > = values in this interval not displayed.    Estimated Creatinine Clearance: 77.9 mL/min (A) (by C-G formula based on SCr of 1.6 mg/dL (H)).  Assessment: 55 y.o. M presents with DKA. CT shows lobar and segmental acute pulmonary emboli on the right. To begin heparin. No AC PTA.  Heparin level down to therapeutic (0.68) on gtt at 1500 units/hr. No bleeding noted.  Goal of Therapy:  Heparin level 0.3-0.7 units/ml Monitor platelets by anticoagulation protocol: Yes   Plan:  Continue heparin gtt at 1500 units/hr  F/u 6 hr heparin level to confirm therapeutic  57, PharmD, BCPS Please see amion for complete clinical pharmacist phone list 09/23/2020 3:18 AM

## 2020-09-23 NOTE — Progress Notes (Signed)
Inpatient Diabetes Program Recommendations  AACE/ADA: New Consensus Statement on Inpatient Glycemic Control (2015)  Target Ranges:  Prepandial:   less than 140 mg/dL      Peak postprandial:   less than 180 mg/dL (1-2 hours)      Critically ill patients:  140 - 180 mg/dL   Lab Results  Component Value Date   GLUCAP 280 (H) 09/23/2020   HGBA1C >15.5 (H) 09/20/2020    Review of Glycemic Control Results for THEON, SOBOTKA (MRN [id number]) as of 09/23/2020 09:32  Ref. Range 09/22/2020 08:12 09/22/2020 11:12 09/22/2020 17:11 09/22/2020 21:15 09/23/2020 06:33  Glucose-Capillary Latest Ref Range: 70 - 99 mg/dL 696 (H) 789 (H) 381 (H) 325 (H) 280 (H)   Diabetes history: DM 2- A1C>15% Outpatient Diabetes medications: None Current orders for Inpatient glycemic control:  Novolog resistant tid with meals and HS Novolog 8 units tid with meals Lantus 50 units daily  Inpatient Diabetes Program Recommendations:    -  increase Lantus to 55 units -  Increase Novolog meal coverage to 12 units.  Due to meal coverage need pt may benefit from 70/30 insulin at time of d/c (40 units bid).  Thanks,  Christena Deem RN, MSN, BC-ADM Inpatient Diabetes Coordinator Team Pager [phone] (8a-5p)

## 2020-09-23 NOTE — Plan of Care (Signed)
Nutrition Education Note  RD consulted for nutrition education regarding diabetes.  Spoke with pt at bedside. Pt reports fair to good appetite and states that today is the first day that he has been "in his right mind" since admission.  Pt reports that he cooks most of his own meals at home and typically eats 3 meals daily. A meal may include beans cooked with meat and vegetables or soup made with rice and tofu. Pt reports that he has issues with his portion sizes which has caused him to gain weight in the past. Pt reports drinking "more tea than is good for me" (hot herbal tea with sugar and milk). He also states that he drinks a fair amount of juice (orange juice, apple juice, grapefruit juice) and enjoys drinking homemade hot chocolate (powdered milk, sugar, cocoa). Pt states that he never learned to drive so he gets around using a bicycle which typically keeps him in good physical health. Pt states that when he found out he had diabetes, he was surprised. He reports that he expected pre-diabetes but not "full-blown" diabetes.  Pt wondering if he will need to purchase a food scale. RD encouraged pt to measure food with measuring cups for a few days after discharge so that he can learn what a recommended serving size looks like. RD discussed phone app options to aid in counting carbohydrates but pt does not like phone apps.  Lab Results  Component Value Date   HGBA1C >15.5 (H) 09/20/2020    RD provided "Carbohydrate Counting for People with Diabetes" handout from the Academy of [employer] Dietetics. Discussed different food groups and their effects on blood sugar, emphasizing carbohydrate-containing foods. Provided list of carbohydrates and recommended serving sizes of common foods. Assisted pt in brainstorming meal ideas using his favorite foods and appropriate portion sizes.  Discussed importance of controlled and consistent carbohydrate intake throughout the day. Provided examples of ways to  balance meals/snacks and encouraged intake of high-fiber, whole grain complex carbohydrates. Teach back method used.  Expect fair to good compliance.  Current diet order is Carb Modified. No meal completions charted at this time but pt reports eating adequately. Labs and medications reviewed. No further nutrition interventions warranted at this time. RD contact information provided. If additional nutrition issues arise, please re-consult RD.   Mertie Clause, MS, RD, LDN Inpatient Clinical Dietitian Please see AMiON for contact information.

## 2020-09-24 LAB — CBC
HCT: 40.8 % (ref 39.0–52.0)
Hemoglobin: 13.6 g/dL (ref 13.0–17.0)
MCH: 31.3 pg (ref 26.0–34.0)
MCHC: 33.3 g/dL (ref 30.0–36.0)
MCV: 94 fL (ref 80.0–100.0)
Platelets: 87 10*3/uL — ABNORMAL LOW (ref 150–400)
RBC: 4.34 MIL/uL (ref 4.22–5.81)
RDW: 12.2 % (ref 11.5–15.5)
WBC: 5.6 10*3/uL (ref 4.0–10.5)
nRBC: 0 % (ref 0.0–0.2)

## 2020-09-24 LAB — MAGNESIUM: Magnesium: 2 mg/dL (ref 1.7–2.4)

## 2020-09-24 LAB — BASIC METABOLIC PANEL
Anion gap: 9 (ref 5–15)
BUN: 14 mg/dL (ref 6–20)
CO2: 24 mmol/L (ref 22–32)
Calcium: 8.3 mg/dL — ABNORMAL LOW (ref 8.9–10.3)
Chloride: 108 mmol/L (ref 98–111)
Creatinine, Ser: 0.9 mg/dL (ref 0.61–1.24)
GFR, Estimated: >60 mL/min (ref >60)
Glucose, Bld: 186 mg/dL — ABNORMAL HIGH (ref 70–99)
Potassium: 3.6 mmol/L (ref 3.5–5.1)
Sodium: 141 mmol/L (ref 135–145)

## 2020-09-24 LAB — GLUCOSE, CAPILLARY
Glucose-Capillary: 180 mg/dL — ABNORMAL HIGH (ref 70–99)
Glucose-Capillary: 228 mg/dL — ABNORMAL HIGH (ref 70–99)
Glucose-Capillary: 245 mg/dL — ABNORMAL HIGH (ref 70–99)
Glucose-Capillary: 162 mg/dL — ABNORMAL HIGH (ref 70–99)

## 2020-09-24 MED ORDER — METOPROLOL TARTRATE 50 MG PO TABS
50.0000 mg | ORAL_TABLET | Freq: Once | ORAL | Status: AC
  Administered 2020-09-24: 50 mg via ORAL
  Filled 2020-09-24: qty 1

## 2020-09-24 MED ORDER — METOPROLOL TARTRATE 50 MG PO TABS
100.0000 mg | ORAL_TABLET | Freq: Two times a day (BID) | ORAL | Status: DC
  Administered 2020-09-25 – 2020-09-26 (×3): 100 mg via ORAL
  Filled 2020-09-24 (×4): qty 2

## 2020-09-24 NOTE — Plan of Care (Signed)

## 2020-09-24 NOTE — Progress Notes (Addendum)
PROGRESS NOTE  Lacy Taglieri  DJS:[id number] DOB: 1966/07/19 DOA: 09/20/2020 PCP: None Brief Narrative: Richard Mullins is a 55 y.o. male with limited medical follow up who presented to the ED with several weeks of polyuria, polydipsia, mental clouding, blurry vision who was found to be in DKA with new diagnosis of T2DM. IV insulin and IVF were given with improved glycemic control and metabolic parameters, subsequently converted to basal-bolus insulin. HbA1c found to be >15.5%. Overnight into 2/3, pt developed AFib w/RVR and CTA chest performed revealing right-sided PE for which diltiazem and heparin gtt's were started. The patient is not hypoxemic.   Assessment & Plan: Principal Problem:   DKA, type 2 ([place]) Active Problems:   AKI (acute kidney injury) ([place])   Lactic acidosis   Hypernatremia   Hypercalcemia   Dehydration  DKA, lactic acidosis, new diagnosis T2DM: Symptoms of hyperglycemia dating several months and HbA1c >15.5% indicate average CBG > 444m/dl. - Fasting CBG 180, will increase modestly to 65u lantus.  - Monitor CBGs on increased mealtime (12u) insulin with plans to increase to 15u if CBGs all remain >2038mdl today.  - Diabetes coordinator consulted. Pt has no insurance, will likely ultimately DC on 70/30 insulin. - Dietitian consulted, care order for insulin administration education in preparation for discharge.  Acute PE: No RV strain on echo. - Continue eliquis. CM consulted for this and PCP for ongoing management.  Atrial fibrillation with RVR: New diagnosis. - Continue anticoagulation - Continue metoprolol tartrate, increased to 5016mose. BP 153/81 this AM, NSR in 80's, will increase further. BB chosen due to mildly elevated troponin which may suggest CAD predisposing to demand myocardial ischemia.   Thrombocytopenia: Platelets 296 > 110 > 87 in past 3 days. Some element of hemoconcentration initially as all cell lines down. With this trend, discharging today on  therapeutic anticoagulation without very close follow up is unsafe.  - Continue anticoagulation, monitor for bleeding, recheck CBC in AM.   Troponin elevation: Demand ischemia.  - Will warrant cardiology evaluation nonemergently.  Acute metabolic encephalopathy: Due to severe hyperglycemia, improved - Supportive care. Education efforts ongoing.  AKI: Resolved.  Hypernatremia, hypercalcemia: Resolved.  LFT elevation:  - Recheck in AM  Psoriasis: Established diagnosis without severe symptoms currently. Pt requesting topical Tx.  - Triamcinolone prn  Abnormal thyroid function tests: Mildly elevated free T4 (1.13 with ULN 1.12) and normal free T3, TSH 0.250 in setting of severe metabolic derangements.  - Recheck TFTs recommended in 4-6 weeks.   Obesity: Estimated body mass index is 37 kg/m as calculated from the following:   Height as of this encounter: 6' 3"  (1.905 m).   Weight as of this encounter: 134.3 kg.  DVT prophylaxis: Eliquis Code Status: Full Family Communication: None at bedside Disposition Plan:  Status is: Inpatient  Remains inpatient appropriate because:Ongoing diagnostic testing needed not appropriate for outpatient work up   Dispo: The patient is from: Home              Anticipated d/c is to: Home              Anticipated d/c date is: 1 day if platelet count stabilizes and no bleeding noted on anticoagulation.              Patient currently is not medically stable to d/c.   Difficult to place patient No  Consultants:   None  Procedures:   None  Antimicrobials:  None   Subjective: No chest pain or palpitations or dyspnea.  No bleeding, hematuria, melena.   Objective: Vitals:   09/23/20 2146 09/23/20 2322 09/24/20 0314 09/24/20 0732  BP: 134/73 118/68 122/71 (!) 153/81  Pulse: 70 70 82 84  Resp:  16 14 16   Temp:  98.2 F (36.8 C) 98.4 F (36.9 C) 99 F (37.2 C)  TempSrc:  Oral Oral Oral  SpO2:  97% 98% 98%  Weight:      Height:         Intake/Output Summary (Last 24 hours) at 09/24/2020 1117 Last data filed at 09/24/2020 0732 Gross per 24 hour  Intake 925 ml  Output 2000 ml  Net -1075 ml   Filed Weights   09/20/20 1002  Weight: 134.3 kg   Gen: 55 y.o. male in no distress Pulm: Nonlabored breathing room air. Clear. CV: Regular rate and rhythm. No murmur, rub, or gallop. No JVD, no dependent edema. GI: Abdomen soft, non-tender, non-distended, with normoactive bowel sounds.  Ext: Warm, no deformities Skin: No rashes, lesions or ulcers on visualized skin. Neuro: Alert and oriented. No focal neurological deficits. Psych: Judgement and insight appear fair. Mood euthymic & affect congruent. Behavior is appropriate.    Data Reviewed: I have personally reviewed following labs and imaging studies  CBC: Recent Labs  Lab 09/20/20 1010 09/20/20 1449 09/23/20 0109 09/24/20 0457  WBC 12.5*  --  10.5 5.6  NEUTROABS 9.8*  --   --   --   HGB 18.6* 18.7* 14.6 13.6  HCT 59.6* 55.0* 42.1 40.8  MCV 100.0  --  95.7 94.0  PLT 296  --  110* 87*   Basic Metabolic Panel: Recent Labs  Lab 09/20/20 2045 09/21/20 0204 09/21/20 0900 09/22/20 0242 09/23/20 1108 09/24/20 0457  NA 156* 161* 161*  --  142 141  K 3.6 3.7 3.4* 3.3* 3.6 3.6  CL 119* 122* 122*  --  110 108  CO2 24 24 24   --  21* 24  GLUCOSE 462* 242* 292*  --  312* 186*  BUN 42* 38* 37*  --  19 14  CREATININE 1.81* 1.63* 1.60*  --  0.92 0.90  CALCIUM 10.6* 10.9* 10.3  --  8.8* 8.3*  MG  --   --   --  2.4 2.1 2.0   Liver Function Tests: Recent Labs  Lab 09/20/20 1010  AST 32  ALT 54*  ALKPHOS 65  BILITOT 2.2*  PROT 8.5*  ALBUMIN 4.0   Recent Labs  Lab 09/20/20 1010  LIPASE 48   CBG: Recent Labs  Lab 09/23/20 0633 09/23/20 1153 09/23/20 1614 09/23/20 2126 09/24/20 0608  GLUCAP 280* 245* 287* 180* 180*   Thyroid Function Tests: Recent Labs    09/22/20 0243 09/22/20 1706  TSH 0.250*  --   FREET4  --  1.13*  T3FREE  --  2.6   Urine  analysis:    Component Value Date/Time   COLORURINE YELLOW 09/20/2020 1603   APPEARANCEUR CLEAR 09/20/2020 1603   LABSPEC 1.015 09/20/2020 1603   PHURINE 5.5 09/20/2020 1603   GLUCOSEU >=500 (A) 09/20/2020 1603   HGBUR SMALL (A) 09/20/2020 1603   BILIRUBINUR NEGATIVE 09/20/2020 1603   KETONESUR 15 (A) 09/20/2020 1603   PROTEINUR NEGATIVE 09/20/2020 1603   NITRITE NEGATIVE 09/20/2020 1603   LEUKOCYTESUR NEGATIVE 09/20/2020 1603   Recent Results (from the past 240 hour(s))  SARS CORONAVIRUS 2 (TAT 6-24 HRS) Nasopharyngeal Nasopharyngeal Swab     Status: None   Collection Time: 09/20/20  3:20 PM   Specimen: Nasopharyngeal Swab  Result Value Ref Range Status   SARS Coronavirus 2 NEGATIVE NEGATIVE Final    Comment: (NOTE) SARS-CoV-2 target nucleic acids are NOT DETECTED.  The SARS-CoV-2 RNA is generally detectable in upper and lower respiratory specimens during the acute phase of infection. Negative results do not preclude SARS-CoV-2 infection, do not rule out co-infections with other pathogens, and should not be used as the sole basis for treatment or other patient management decisions. Negative results must be combined with clinical observations, patient history, and epidemiological information. The expected result is Negative.  Fact Sheet for Patients: SugarRoll.be  Fact Sheet for Healthcare Providers: https://www.woods-mathews.com/  This test is not yet approved or cleared by the [place] FDA and  has been authorized for detection and/or diagnosis of SARS-CoV-2 by FDA under an Emergency Use Authorization (EUA). This EUA will remain  in effect (meaning this test can be used) for the duration of the COVID-19 declaration under Se ction 564(b)(1) of the Act, 21 U.S.C. section 360bbb-3(b)(1), unless the authorization is terminated or revoked sooner.  Performed at [facility], [place] [address] [place], [place] [address]    MRSA PCR Screening     Status: None   Collection Time: 09/22/20  6:03 PM   Specimen: Nasopharyngeal  Result Value Ref Range Status   MRSA by PCR NEGATIVE NEGATIVE Final    Comment:        The GeneXpert MRSA Assay (FDA approved for NASAL specimens only), is one component of a comprehensive MRSA colonization surveillance program. It is not intended to diagnose MRSA infection nor to guide or monitor treatment for MRSA infections. Performed at [facility], [place] [address] [place], [place] [address]       Scheduled Meds: . apixaban  10 mg Oral BID   Followed by  . [START ON 09/30/2020] apixaban  5 mg Oral BID  . insulin aspart  0-20 Units Subcutaneous TID WC  . insulin aspart  0-5 Units Subcutaneous QHS  . insulin aspart  12 Units Subcutaneous TID WC  . insulin glargine  60 Units Subcutaneous Daily  . insulin starter kit- pen needles  1 kit Other Once  . lactulose  20 g Oral TID  . living well with diabetes book   Does not apply Once  . metoprolol tartrate  50 mg Oral BID  . pantoprazole  40 mg Oral Daily  . polyethylene glycol  17 g Oral Daily  . senna-docusate  2 tablet Oral BID  . triamcinolone   Topical TID   Continuous Infusions:    LOS: 4 days   Time spent: 35 minutes.  Patrecia Pour, MD [facility] [email or web address] 09/24/2020, 11:17 AM

## 2020-09-25 DIAGNOSIS — E1110 Type 2 diabetes mellitus with ketoacidosis without coma: Principal | ICD-10-CM

## 2020-09-25 DIAGNOSIS — I4891 Unspecified atrial fibrillation: Secondary | ICD-10-CM

## 2020-09-25 DIAGNOSIS — D696 Thrombocytopenia, unspecified: Secondary | ICD-10-CM

## 2020-09-25 LAB — COMPREHENSIVE METABOLIC PANEL
ALT: 59 U/L — ABNORMAL HIGH (ref 0–44)
AST: 55 U/L — ABNORMAL HIGH (ref 15–41)
Albumin: 2.5 g/dL — ABNORMAL LOW (ref 3.5–5.0)
Alkaline Phosphatase: 42 U/L (ref 38–126)
Anion gap: 10 (ref 5–15)
BUN: 11 mg/dL (ref 6–20)
CO2: 20 mmol/L — ABNORMAL LOW (ref 22–32)
Calcium: 8.4 mg/dL — ABNORMAL LOW (ref 8.9–10.3)
Chloride: 110 mmol/L (ref 98–111)
Creatinine, Ser: 0.98 mg/dL (ref 0.61–1.24)
GFR, Estimated: >60 mL/min (ref >60)
Glucose, Bld: 153 mg/dL — ABNORMAL HIGH (ref 70–99)
Potassium: 3.4 mmol/L — ABNORMAL LOW (ref 3.5–5.1)
Sodium: 140 mmol/L (ref 135–145)
Total Bilirubin: 1 mg/dL (ref 0.3–1.2)
Total Protein: 5.6 g/dL — ABNORMAL LOW (ref 6.5–8.1)

## 2020-09-25 LAB — CBC
HCT: 38.3 % — ABNORMAL LOW (ref 39.0–52.0)
Hemoglobin: 13.2 g/dL (ref 13.0–17.0)
MCH: 31.7 pg (ref 26.0–34.0)
MCHC: 34.5 g/dL (ref 30.0–36.0)
MCV: 92.1 fL (ref 80.0–100.0)
Platelets: 89 10*3/uL — ABNORMAL LOW (ref 150–400)
RBC: 4.16 MIL/uL — ABNORMAL LOW (ref 4.22–5.81)
RDW: 12.4 % (ref 11.5–15.5)
WBC: 5.6 10*3/uL (ref 4.0–10.5)
nRBC: 0 % (ref 0.0–0.2)

## 2020-09-25 LAB — GLUCOSE, CAPILLARY
Glucose-Capillary: 153 mg/dL — ABNORMAL HIGH (ref 70–99)
Glucose-Capillary: 256 mg/dL — ABNORMAL HIGH (ref 70–99)
Glucose-Capillary: 152 mg/dL — ABNORMAL HIGH (ref 70–99)
Glucose-Capillary: 145 mg/dL — ABNORMAL HIGH (ref 70–99)

## 2020-09-25 LAB — IMMATURE PLATELET FRACTION: Immature Platelet Fraction: 8.9 % — ABNORMAL HIGH (ref 1.2–8.6)

## 2020-09-25 LAB — SAVE SMEAR(SSMR), FOR PROVIDER SLIDE REVIEW

## 2020-09-25 MED ORDER — POTASSIUM CHLORIDE CRYS ER 20 MEQ PO TBCR
20.0000 meq | EXTENDED_RELEASE_TABLET | Freq: Once | ORAL | Status: AC
  Administered 2020-09-25: 20 meq via ORAL
  Filled 2020-09-25: qty 1

## 2020-09-25 NOTE — Consult Note (Signed)
[facility] CONSULT NOTE  Patient Care Team: Patient, No Pcp Per as PCP - General (General Practice)  ASSESSMENT & PLAN Acute thrombocytopenia I believe the patient might have mild chronic thrombocytopenia, likely due to hepatic steatosis and grossly abnormal liver enzymes on admission There is also a component of hemodilution as well as platelet clumping I will follow in the outpatient There is no contraindication to remain on antiplatelet agents or anticoagulants as long as the platelet is greater than 50,000.  Asymptomatic PE Given his chronic bilateral lower extremity edema, I suspect his source might be his lower extremities, precipitated by recent dehydration He will continue anticoagulation for minimum 6 months  Newly diagnosed diabetes I would defer to primary service for management He will need to be established with primary doctor for management of diabetes  Discharge planning I reviewed the plan of care with the patient and primary service He can be discharged from my perspective I will set up outpatient follow-up on February 15  All questions were answered. The patient knows to call the clinic with any problems, questions or concerns. No barriers to learning was detected.  Heath Lark, MD 09/25/2020 2:05 PM    CHIEF COMPLAINTS/PURPOSE OF CONSULTATION:  Progressive thrombocytopenia and recent PE  HISTORY OF PRESENTING ILLNESS:  Richard Mullins 55 y.o. male is seen here because of thrombocytopenia and recent diagnosis of PE  The patient is a reasonable historian He lives alone by himself, single with no children He worked part-time with a [employer] He does not have a primary care doctor and has not seen physician for some time He is otherwise healthy  He has not been feeling well since August of last year with feeling unwell, blurriness of vision and nonspecific weight loss He also have frequent urination In 2019, he had diagnosis of  kidney stone He admits to poor dietary choices and drink plenty of soda, sweetened beverages and juices He complains of fatigue He denies chest pain or shortness of breath but felt so poorly to the point that he presented to the emergency department several days ago He was found to have uncontrolled diabetes, atrial fibrillation as well as pulmonary embolism We do not have baseline blood work but on admission on 09/20/2020, he has elevated white blood cell count, hemoglobin of 18.6 and a platelet count of 296,000 CT imaging show PE as well as hepatic steatosis He was placed on aggressive insulin regimen for uncontrolled hyperglycemia His first measure blood sugar was over 1000 and he presented with signs of acute kidney injury, hypercalcemia and liver failure  After aggressive management and hydration, his hemoglobin is now 13.2 but he has platelet count trending down to slightly under 100,000.  His liver failure is almost back to normal His serum creatinine has normalized and his calcium are now normal He underwent echocardiogram which showed preserved ejection fraction On monitor today, he appears to be in sinus rhythm He was originally placed on IV heparin but then transitioned to Eliquis  Due to low platelet count, I was consulted for evaluation He has no signs of bleeding since admission  In the background, he has occasional intermittent right-sided nosebleed and occasional bright red blood per rectum He has not had colonoscopy due to lack of insurance  He denies prior blood or platelet transfusions He had minor surgery with wisdom tooth extraction in the past He has no prior history of blood clots He has no family history of blood clots He denies testosterone use  He is not a smoker The patient stated he does not own a car and typically either walk or bicycle to grocery stores for supplies He is noted to have chronic bilateral lower extremity edema which he attributed to other  causes He denies trauma to his lower extremities  MEDICAL HISTORY:  Past Medical History:  Diagnosis Date  . Complication of anesthesia    DURING DENTAL PROCEDURE  . Diabetes mellitus without complication ([place])   . PONV (postoperative nausea and vomiting)   . Psoriasis     SURGICAL HISTORY: Past Surgical History:  Procedure Laterality Date  . WISDOM TOOTH EXTRACTION      SOCIAL HISTORY: Social History   Socioeconomic History  . Marital status: Single    Spouse name: Not on file  . Number of children: Not on file  . Years of education: Not on file  . Highest education level: Not on file  Occupational History  . Not on file  Tobacco Use  . Smoking status: Never Smoker  . Smokeless tobacco: Never Used  Vaping Use  . Vaping Use: Never used  Substance and Sexual Activity  . Alcohol use: Yes    Comment: occ  . Drug use: Not Currently  . Sexual activity: Yes  Other Topics Concern  . Not on file  Social History Narrative  . Not on file   Social Determinants of Health   Financial Resource Strain: Not on file  Food Insecurity: Not on file  Transportation Needs: Not on file  Physical Activity: Not on file  Stress: Not on file  Social Connections: Not on file  Intimate Partner Violence: Not on file    FAMILY HISTORY: Family History  Problem Relation Age of Onset  . Cancer Father     ALLERGIES:  is allergic to microplegia msa-msg [plegisol].  MEDICATIONS:  Current Facility-Administered Medications  Medication Dose Route Frequency Provider Last Rate Last Admin  . acetaminophen (TYLENOL) tablet 650 mg  650 mg Oral Q6H PRN Lavina Hamman, MD      . apixaban Arne Cleveland) tablet 10 mg  10 mg Oral BID Einar Grad, RPH   10 mg at 09/25/20 1610   Followed by  . [START ON 09/30/2020] apixaban (ELIQUIS) tablet 5 mg  5 mg Oral BID Einar Grad, [facility]      . bisacodyl (DULCOLAX) suppository 10 mg  10 mg Rectal Daily PRN Lavina Hamman, MD      . dextrose 50 %  solution 0-50 mL  0-50 mL Intravenous PRN Lavina Hamman, MD      . insulin aspart (novoLOG) injection 0-20 Units  0-20 Units Subcutaneous TID WC Patrecia Pour, MD   11 Units at 09/25/20 1133  . insulin aspart (novoLOG) injection 0-5 Units  0-5 Units Subcutaneous QHS Patrecia Pour, MD   4 Units at 09/22/20 2200  . insulin aspart (novoLOG) injection 12 Units  12 Units Subcutaneous TID WC Patrecia Pour, MD   12 Units at 09/25/20 1133  . insulin glargine (LANTUS) injection 60 Units  60 Units Subcutaneous Daily Patrecia Pour, MD   60 Units at 09/25/20 [phone]  . insulin starter kit- pen needles (English) 1 kit  1 kit Other Once Patrecia Pour, MD      . lactulose (CHRONULAC) 10 GM/15ML solution 20 g  20 g Oral TID Lavina Hamman, MD   20 g at 09/24/20 0915  . living well with diabetes book MISC   Does not  apply Once Patrecia Pour, MD      . metoprolol tartrate (LOPRESSOR) tablet 100 mg  100 mg Oral BID Patrecia Pour, MD   100 mg at 09/25/20 1517  . ondansetron (ZOFRAN) injection 4 mg  4 mg Intravenous Q6H PRN Lavina Hamman, MD      . pantoprazole (PROTONIX) EC tablet 40 mg  40 mg Oral Daily Lavina Hamman, MD   40 mg at 09/25/20 6160  . polyethylene glycol (MIRALAX / GLYCOLAX) packet 17 g  17 g Oral Daily Lavina Hamman, MD   17 g at 09/24/20 0915  . senna-docusate (Senokot-S) tablet 2 tablet  2 tablet Oral BID Lavina Hamman, MD   2 tablet at 09/24/20 7371  . triamcinolone (KENALOG) 0.1 % cream   Topical TID Patrecia Pour, MD   Given at 09/25/20 [phone]    REVIEW OF SYSTEMS:   Constitutional: Denies fevers, chills or abnormal night sweats Eyes: Denies blurriness of vision, double vision or watery eyes Ears, nose, mouth, throat, and face: Denies mucositis or sore throat Respiratory: Denies cough, dyspnea or wheezes Cardiovascular: Denies palpitation, chest discomfort  Gastrointestinal:  Denies nausea, heartburn or change in bowel habits Skin: Denies abnormal skin rashes Lymphatics: Denies new  lymphadenopathy or easy bruising Neurological:Denies numbness, tingling or new weaknesses Behavioral/Psych: Mood is stable, no new changes  All other systems were reviewed with the patient and are negative.  PHYSICAL EXAMINATION: ECOG PERFORMANCE STATUS: 1 - Symptomatic but completely ambulatory  Vitals:   09/25/20 0800 09/25/20 1119  BP: 137/75 117/69  Pulse: 76 78  Resp: 19 18  Temp: 98.8 F (37.1 C) 99.3 F (37.4 C)  SpO2:  98%   Filed Weights   09/20/20 1002  Weight: 296 lb (134.3 kg)    GENERAL:alert, no distress and comfortable.   SKIN: Noted skin discoloration with chronic venous stasis of both lower extremity EYES: normal, conjunctiva are pink and non-injected, sclera clear OROPHARYNX:no exudate, no erythema and lips, buccal mucosa, and tongue normal  NECK: supple, thyroid normal size, non-tender, without nodularity LYMPH:  no palpable lymphadenopathy in the cervical, axillary or inguinal LUNGS: clear to auscultation and percussion with normal breathing effort HEART: regular rate & rhythm and no murmurs with mild bilateral lower extremity edema ABDOMEN:abdomen soft, non-tender and normal bowel sounds Musculoskeletal:no cyanosis of digits and no clubbing  PSYCH: alert & oriented x 3 with fluent speech NEURO: no focal motor/sensory deficits  LABORATORY DATA:  I have reviewed the data as listed Lab Results  Component Value Date   WBC 5.6 09/25/2020   HGB 13.2 09/25/2020   HCT 38.3 (L) 09/25/2020   MCV 92.1 09/25/2020   PLT 89 (L) 09/25/2020   I have reviewed his peripheral blood smear.  Occasional platelet clumping is noted  RADIOGRAPHIC STUDIES: I have personally reviewed the radiological images as listed and agreed with the findings in the report. CT ANGIO CHEST PE W OR WO CONTRAST  Result Date: 09/22/2020 CLINICAL DATA:  Increased heart rate and shortness of breath. Recent cardioversion. COVID positive EXAM: CT ANGIOGRAPHY CHEST WITH CONTRAST TECHNIQUE:  Multidetector CT imaging of the chest was performed using the standard protocol during bolus administration of intravenous contrast. Multiplanar CT image reconstructions and MIPs were obtained to evaluate the vascular anatomy. CONTRAST:  191m OMNIPAQUE IOHEXOL 350 MG/ML SOLN COMPARISON:  None. FINDINGS: Cardiovascular: Branching pulmonary artery filling defects within anterior basal segment right lower lobe and branching in the right upper lobe pulmonary artery. The  clot burden is smaller than would be expected to cause right heart dilatation. No cardiomegaly or pericardial effusion. Unremarkable aorta Mediastinum/Nodes: Negative for adenopathy or mass. Lungs/Pleura: Central airways are clear. No failure or lung infarct. Mild atelectasis in the left lower lung. Upper Abdomen: Hepatic steatosis. Punctate left upper pole calculus. Musculoskeletal: No acute or aggressive finding. Review of the MIP images confirms the above findings. Critical Value/emergent results were called by telephone at the time of interpretation on 09/22/2020 at 5:46 am to provider [facility] , who verbally acknowledged these results. IMPRESSION: 1. Positive for lobar and segmental acute pulmonary emboli on the right. 2. Hepatic steatosis. Electronically Signed   By: Monte Fantasia M.D.   On: 09/22/2020 05:47   DG CHEST PORT 1 VIEW  Result Date: 09/22/2020 CLINICAL DATA:  Atrial fibrillation and shortness of breath EXAM: PORTABLE CHEST 1 VIEW COMPARISON:  09/20/2020 FINDINGS: Cardiac shadow is within normal limits. The lungs are well aerated bilaterally. No focal infiltrate or sizable effusion is seen. No bony abnormality is noted. IMPRESSION: No acute abnormality noted. Electronically Signed   By: Inez Catalina M.D.   On: 09/22/2020 03:19   DG Chest Portable 1 View  Result Date: 09/20/2020 CLINICAL DATA:  Shortness of breath EXAM: PORTABLE CHEST 1 VIEW COMPARISON:  None. FINDINGS: Low lung volumes. No consolidation or edema. No  pleural effusion or pneumothorax. Cardiomediastinal contours are within normal limits. IMPRESSION: No acute process in the chest. Electronically Signed   By: Macy Mis M.D.   On: 09/20/2020 14:10   DG Abd Portable 1V  Result Date: 09/20/2020 CLINICAL DATA:  Intractable nausea and vomiting. EXAM: PORTABLE ABDOMEN - 1 VIEW COMPARISON:  None. FINDINGS: The bowel gas pattern is normal. No radio-opaque calculi or other significant radiographic abnormality are seen. IMPRESSION: Negative. Electronically Signed   By: Constance Holster M.D.   On: 09/20/2020 16:50   ECHOCARDIOGRAM COMPLETE  Result Date: 09/22/2020    ECHOCARDIOGRAM REPORT   Patient Name:   Richard Mullins Date of Exam: 09/22/2020 Medical Rec #:  [id number]      Height:       75.0 in Accession #:    [id number]     Weight:       296.0 lb Date of Birth:  Sep 15, 1965      BSA:          2.592 m Patient Age:    47 years       BP:           121/68 mmHg Patient Gender: M              HR:           81 bpm. Exam Location:  Inpatient Procedure: 2D Echo and Intracardiac Opacification Agent Indications:    Atrial Fibrillation I48.91  History:        Patient has no prior history of Echocardiogram examinations.  Sonographer:    Mikki Santee RDCS (AE) Referring Phys: [id number] [place]  1. Poor acoustic windows limit study.  2. Left ventricular ejection fraction, by estimation, is 65 to 70%. The left ventricle has normal function. The left ventricle has no regional wall motion abnormalities. There is not assessed left ventricular hypertrophy. Left ventricular diastolic parameters are consistent with Grade I diastolic dysfunction (impaired relaxation).  3. Right ventricular systolic function is normal. The right ventricular size is normal.  4. Left atrial size was mildly dilated.  5. The mitral valve is normal in structure. Trivial mitral valve  regurgitation.  6. The aortic valve is normal in structure. Aortic valve regurgitation is not  visualized.  7. Pulmonic valve regurgitation not assessed.  8. The inferior vena cava normal size. FINDINGS  Left Ventricle: Left ventricular ejection fraction, by estimation, is 65 to 70%. The left ventricle has normal function. The left ventricle has no regional wall motion abnormalities. The left ventricular internal cavity size was normal in size. There is  not assessed left ventricular hypertrophy. Left ventricular diastolic parameters are consistent with Grade I diastolic dysfunction (impaired relaxation). Right Ventricle: The right ventricular size is normal. Right vetricular wall thickness was not assessed. Right ventricular systolic function is normal. Left Atrium: Left atrial size was mildly dilated. Right Atrium: Right atrial size was normal in size. Pericardium: There is no evidence of pericardial effusion. Mitral Valve: The mitral valve is normal in structure. Trivial mitral valve regurgitation. Tricuspid Valve: The tricuspid valve is normal in structure. Tricuspid valve regurgitation is trivial. Aortic Valve: The aortic valve is normal in structure. Aortic valve regurgitation is not visualized. Pulmonic Valve: The pulmonic valve was not well visualized. Pulmonic valve regurgitation not assessed. Aorta: The aortic root is normal in size and structure. Venous: The inferior vena cava normal size. IAS/Shunts: The interatrial septum was not assessed.   Diastology LV e' medial:    6.85 cm/s LV E/e' medial:  8.7 LV e' lateral:   8.38 cm/s LV E/e' lateral: 7.1  RIGHT VENTRICLE RV S prime:     19.60 cm/s TAPSE (M-mode): 2.3 cm LEFT ATRIUM              Index       RIGHT ATRIUM           Index LA diam:        3.10 cm  1.20 cm/m  RA Area:     14.00 cm LA Vol (A2C):   82.8 ml  31.94 ml/m RA Volume:   32.20 ml  12.42 ml/m LA Vol (A4C):   105.0 ml 40.51 ml/m LA Biplane Vol: 100.0 ml 38.58 ml/m  AORTIC VALVE LVOT Vmax:   107.00 cm/s LVOT Vmean:  63.200 cm/s LVOT VTI:    0.224 m MITRAL VALVE MV Area (PHT): 3.17  cm    SHUNTS MV Decel Time: 239 msec    Systemic VTI: 0.22 m MV E velocity: 59.60 cm/s MV A velocity: 51.00 cm/s MV E/A ratio:  1.17 Dorris Carnes MD Electronically signed by Dorris Carnes MD Signature Date/Time: 09/22/2020/1:45:47 PM    Final

## 2020-09-25 NOTE — Progress Notes (Signed)
PROGRESS NOTE  Richard Mullins  FGH:[id number] DOB: 27-Mar-1966 DOA: 09/20/2020 PCP: None Brief Narrative: Richard Mullins is a 55 y.o. male with limited medical follow up who presented to the ED with several weeks of polyuria, polydipsia, mental clouding, blurry vision who was found to be in DKA with new diagnosis of T2DM. IV insulin and IVF were given with improved glycemic control and metabolic parameters, subsequently converted to basal-bolus insulin. HbA1c found to be >15.5%. Overnight into 2/3, pt developed AFib w/RVR and CTA chest performed revealing right-sided PE for which diltiazem and heparin gtt's were started. The patient is not hypoxemic.   Assessment & Plan: Principal Problem:   DKA, type 2 ([place]) Active Problems:   AKI (acute kidney injury) ([place])   Lactic acidosis   Hypernatremia   Hypercalcemia   Dehydration  DKA, lactic acidosis, new diagnosis T2DM: Symptoms of hyperglycemia dating several months and HbA1c >15.5% indicate average CBG > 461m/dl. - Continue lantus 60u daily with novolog 12u mealtime + resistant SSI and HS correction. Control is nearly optimized and pt's education is ongoing. - Diabetes coordinator consulted. Pt has no insurance, will likely ultimately DC on 70/30 insulin. - Dietitian consulted, care order for insulin administration education in preparation for discharge.  Acute PE: No RV strain on echo. - Continue eliquis. CM consulted for this and PCP for ongoing management.  Atrial fibrillation with RVR: New diagnosis. - Continue anticoagulation - Continue metoprolol tartrate, some asymptomatic sinus brady overnight. BB chosen due to mildly elevated troponin which may suggest CAD predisposing to demand myocardial ischemia.   Thrombocytopenia: Platelets 296 > 110 > 87 > 89. Some element of hemoconcentration initially as all cell lines down. Hepatic steatosis without nodularity noted on non-dedicated imaging of liver. Pt doesn't have EtOH history. 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 No bleeding.  - Continue anticoagulation - IPF add-on, save smear. D/w Dr. GAlvy Bimlerfor consultation.   Troponin elevation: Demand ischemia.  - Will warrant cardiology evaluation nonemergently.  Acute metabolic encephalopathy: Due to severe hyperglycemia, improved - Supportive care. Education efforts ongoing.  Venous insufficiency with stasis dermatitis:  - Elevation, compression. Has compression stockings at home.  AKI: Resolved.  Hypernatremia, hypercalcemia: Resolved.  LFT elevation: Hepatic steatosis noted on CTA chest.  - Stable.  Psoriasis: Established diagnosis without severe symptoms currently. Pt requesting topical Tx.  - Triamcinolone prn  Abnormal thyroid function tests: Mildly elevated free T4 (1.13 with ULN 1.12) and normal free T3, TSH 0.250 in setting of severe metabolic derangements.  - Recheck TFTs recommended in 4-6 weeks.   Obesity: Estimated body mass index is 37 kg/m as calculated from the following:   Height as of this encounter: 6' 3"  (1.905 m).   Weight as of this encounter: 134.3 kg.  DVT prophylaxis: Eliquis Code Status: Full Family Communication: None at bedside Disposition Plan:  Status is: Inpatient  Remains inpatient appropriate because:Ongoing diagnostic testing needed not appropriate for outpatient work up  Dispo: The patient is from: Home              Anticipated d/c is to: Home              Anticipated d/c date is: 09/26/2020              Patient currently is not medically stable to d/c.   Difficult to place patient No  Consultants:   Heme/onc  Procedures:   None  Antimicrobials:  None  Subjective: Legs are back to swelling which is baseline when he doesn't wear compression stockings. No chest pain or dyspnea. Does get winded when walking in halls but this is overall improving. No palpitations. Denies uncontrolled  bleeding.  Objective: Vitals:   09/24/20 2128 09/24/20 2307 09/25/20 0300 09/25/20 0800  BP: 96/64 122/66 (!) 104/56 137/75  Pulse: 69 72 70 76  Resp: 16 15 16 19   Temp:  98.5 F (36.9 C) 98.4 F (36.9 C) 98.8 F (37.1 C)  TempSrc:  Oral Oral Oral  SpO2:  98% 98%   Weight:      Height:        Intake/Output Summary (Last 24 hours) at 09/25/2020 6378 Last data filed at 09/25/2020 0800 Gross per 24 hour  Intake 1200 ml  Output 3360 ml  Net -2160 ml   Filed Weights   09/20/20 1002  Weight: 134.3 kg   Gen: 55 y.o. male in no distress Pulm: Nonlabored breathing room air. Clear. CV: Regular rate and rhythm, vent rate 80. No murmur, rub, or gallop. No JVD, 1+ pitting dependent edema with associated lower leg hyperpigmentation. GI: Abdomen soft, non-tender, non-distended, with normoactive bowel sounds.  Ext: Warm, no deformities Skin: Left AC ecchymosis mild, skin puncture sites hemostatic. No other rashes, lesions or ulcers on visualized skin. Neuro: Alert and oriented. No focal neurological deficits. Psych: Judgement and insight appear fair. Mood euthymic & affect congruent. Behavior is appropriate.    Data Reviewed: I have personally reviewed following labs and imaging studies  CBC: Recent Labs  Lab 09/20/20 1010 09/20/20 1449 09/23/20 0109 09/24/20 0457 09/25/20 0145  WBC 12.5*  --  10.5 5.6 5.6  NEUTROABS 9.8*  --   --   --   --   HGB 18.6* 18.7* 14.6 13.6 13.2  HCT 59.6* 55.0* 42.1 40.8 38.3*  MCV 100.0  --  95.7 94.0 92.1  PLT 296  --  110* 87* 89*   Basic Metabolic Panel: Recent Labs  Lab 09/21/20 0204 09/21/20 0900 09/22/20 0242 09/23/20 1108 09/24/20 0457 09/25/20 0145  NA 161* 161*  --  142 141 140  K 3.7 3.4* 3.3* 3.6 3.6 3.4*  CL 122* 122*  --  110 108 110  CO2 24 24  --  21* 24 20*  GLUCOSE 242* 292*  --  312* 186* 153*  BUN 38* 37*  --  19 14 11   CREATININE 1.63* 1.60*  --  0.92 0.90 0.98  CALCIUM 10.9* 10.3  --  8.8* 8.3* 8.4*  MG  --   --   2.4 2.1 2.0  --    Liver Function Tests: Recent Labs  Lab 09/20/20 1010 09/25/20 0145  AST 32 55*  ALT 54* 59*  ALKPHOS 65 42  BILITOT 2.2* 1.0  PROT 8.5* 5.6*  ALBUMIN 4.0 2.5*   Recent Labs  Lab 09/20/20 1010  LIPASE 48   CBG: Recent Labs  Lab 09/24/20 0608 09/24/20 1118 09/24/20 1617 09/24/20 2121 09/25/20 0618  GLUCAP 180* 228* 245* 162* 153*   Thyroid Function Tests: Recent Labs    09/22/20 1706  FREET4 1.13*  T3FREE 2.6   Urine analysis:    Component Value Date/Time   COLORURINE YELLOW 09/20/2020 1603   APPEARANCEUR CLEAR 09/20/2020 1603   LABSPEC 1.015 09/20/2020 1603   PHURINE 5.5 09/20/2020 1603   GLUCOSEU >=500 (A) 09/20/2020 1603   HGBUR SMALL (A) 09/20/2020 1603   BILIRUBINUR NEGATIVE 09/20/2020 1603   KETONESUR 15 (A) 09/20/2020 1603  PROTEINUR NEGATIVE 09/20/2020 1603   NITRITE NEGATIVE 09/20/2020 1603   LEUKOCYTESUR NEGATIVE 09/20/2020 1603   Recent Results (from the past 240 hour(s))  SARS CORONAVIRUS 2 (TAT 6-24 HRS) Nasopharyngeal Nasopharyngeal Swab     Status: None   Collection Time: 09/20/20  3:20 PM   Specimen: Nasopharyngeal Swab  Result Value Ref Range Status   SARS Coronavirus 2 NEGATIVE NEGATIVE Final    Comment: (NOTE) SARS-CoV-2 target nucleic acids are NOT DETECTED.  The SARS-CoV-2 RNA is generally detectable in upper and lower respiratory specimens during the acute phase of infection. Negative results do not preclude SARS-CoV-2 infection, do not rule out co-infections with other pathogens, and should not be used as the sole basis for treatment or other patient management decisions. Negative results must be combined with clinical observations, patient history, and epidemiological information. The expected result is Negative.  Fact Sheet for Patients: SugarRoll.be  Fact Sheet for Healthcare Providers: https://www.woods-mathews.com/  This test is not yet approved or cleared  by the [place] FDA and  has been authorized for detection and/or diagnosis of SARS-CoV-2 by FDA under an Emergency Use Authorization (EUA). This EUA will remain  in effect (meaning this test can be used) for the duration of the COVID-19 declaration under Se ction 564(b)(1) of the Act, 21 U.S.C. section 360bbb-3(b)(1), unless the authorization is terminated or revoked sooner.  Performed at [facility], [place] [address], [place], [place] [address]   MRSA PCR Screening     Status: None   Collection Time: 09/22/20  6:03 PM   Specimen: Nasopharyngeal  Result Value Ref Range Status   MRSA by PCR NEGATIVE NEGATIVE Final    Comment:        The GeneXpert MRSA Assay (FDA approved for NASAL specimens only), is one component of a comprehensive MRSA colonization surveillance program. It is not intended to diagnose MRSA infection nor to guide or monitor treatment for MRSA infections. Performed at [facility], [place] [address], [place], [place] [address]       Scheduled Meds: . apixaban  10 mg Oral BID   Followed by  . [START ON 09/30/2020] apixaban  5 mg Oral BID  . insulin aspart  0-20 Units Subcutaneous TID WC  . insulin aspart  0-5 Units Subcutaneous QHS  . insulin aspart  12 Units Subcutaneous TID WC  . insulin glargine  60 Units Subcutaneous Daily  . insulin starter kit- pen needles  1 kit Other Once  . lactulose  20 g Oral TID  . living well with diabetes book   Does not apply Once  . metoprolol tartrate  100 mg Oral BID  . pantoprazole  40 mg Oral Daily  . polyethylene glycol  17 g Oral Daily  . senna-docusate  2 tablet Oral BID  . triamcinolone   Topical TID   Continuous Infusions:    LOS: 5 days   Time spent: 35 minutes.  Patrecia Pour, MD [facility] [email or web address] 09/25/2020, 9:36 AM

## 2020-09-25 NOTE — Plan of Care (Signed)

## 2020-09-26 ENCOUNTER — Other Ambulatory Visit (HOSPITAL_COMMUNITY): Payer: Self-pay | Admitting: Internal Medicine

## 2020-09-26 LAB — BASIC METABOLIC PANEL
Anion gap: 8 (ref 5–15)
BUN: 10 mg/dL (ref 6–20)
CO2: 22 mmol/L (ref 22–32)
Calcium: 8.6 mg/dL — ABNORMAL LOW (ref 8.9–10.3)
Chloride: 109 mmol/L (ref 98–111)
Creatinine, Ser: 1.16 mg/dL (ref 0.61–1.24)
GFR, Estimated: >60 mL/min (ref >60)
Glucose, Bld: 175 mg/dL — ABNORMAL HIGH (ref 70–99)
Potassium: 3.6 mmol/L (ref 3.5–5.1)
Sodium: 139 mmol/L (ref 135–145)

## 2020-09-26 LAB — GLUCOSE, CAPILLARY
Glucose-Capillary: 218 mg/dL — ABNORMAL HIGH (ref 70–99)
Glucose-Capillary: 280 mg/dL — ABNORMAL HIGH (ref 70–99)

## 2020-09-26 LAB — CBC
HCT: 41.2 % (ref 39.0–52.0)
Hemoglobin: 14 g/dL (ref 13.0–17.0)
MCH: 31.7 pg (ref 26.0–34.0)
MCHC: 34 g/dL (ref 30.0–36.0)
MCV: 93.4 fL (ref 80.0–100.0)
Platelets: 101 10*3/uL — ABNORMAL LOW (ref 150–400)
RBC: 4.41 MIL/uL (ref 4.22–5.81)
RDW: 12.3 % (ref 11.5–15.5)
WBC: 5.4 10*3/uL (ref 4.0–10.5)
nRBC: 0 % (ref 0.0–0.2)

## 2020-09-26 MED ORDER — APIXABAN 5 MG PO TABS: 10.0000 mg | ORAL_TABLET | Freq: Two times a day (BID) | ORAL | 0 refills | Status: DC

## 2020-09-26 MED ORDER — TRIAMCINOLONE ACETONIDE 0.1 % EX CREA: TOPICAL_CREAM | Freq: Three times a day (TID) | CUTANEOUS | 0 refills | Status: DC

## 2020-09-26 MED ORDER — APIXABAN 5 MG PO TABS: 5.0000 mg | ORAL_TABLET | Freq: Two times a day (BID) | ORAL | 3 refills | Status: DC

## 2020-09-26 MED ORDER — METOPROLOL TARTRATE 100 MG PO TABS: 100.0000 mg | ORAL_TABLET | Freq: Two times a day (BID) | ORAL | 2 refills | Status: DC

## 2020-09-26 MED ORDER — INSULIN STARTER KIT- PEN NEEDLES (ENGLISH): 1.0000 | Freq: Once | 0 refills | Status: AC

## 2020-09-26 MED ORDER — PEN NEEDLES 32G X 5 MM MISC: 1.0000 "application " | Freq: Two times a day (BID) | 5 refills | Status: DC

## 2020-09-26 MED ORDER — NOVOLIN 70/30 FLEXPEN RELION (70-30) 100 UNIT/ML ~~LOC~~ SUPN: 40.0000 [IU] | PEN_INJECTOR | Freq: Two times a day (BID) | SUBCUTANEOUS | 11 refills | Status: DC

## 2020-09-26 MED ORDER — INSULIN STARTER KIT- PEN NEEDLES (ENGLISH): 1.0000 | Freq: Once | 0 refills | Status: DC

## 2020-09-26 MED FILL — NOVOLOG MIX 70-30 FLEXPEN S: (70-30) 100 | 28 days supply | Qty: 24 | Fill #0

## 2020-09-26 MED FILL — PENTIPS 32G X 4 MM MISC: 32G X 4 MM | 30 days supply | Qty: 100 | Fill #0

## 2020-09-26 MED FILL — METOPROLOL TARTRATE 100 MG: 100 | 30 days supply | Qty: 60 | Fill #0

## 2020-09-26 MED FILL — ELIQUIS 5 MG TABLET: 5 | 30 days supply | Qty: 74 | Fill #0

## 2020-09-26 MED FILL — TRIAMCINOLONE 0.1% CREAM: 0.1 | 10 days supply | Qty: 30 | Fill #0

## 2020-09-26 NOTE — Progress Notes (Signed)
All set to discharge home awating med from  Transition of care pharm.

## 2020-09-26 NOTE — Progress Notes (Signed)
Discharged home accompanied by friend. Belongings taken home,

## 2020-09-26 NOTE — Progress Notes (Addendum)
3 diabetes video watched  by pt. Very assertive with education.

## 2020-09-26 NOTE — Discharge Summary (Signed)
Physician Discharge Summary  Richard Mullins TMH:[id number] DOB: 12/14/1965 DOA: 09/20/2020  PCP: Patient, No Pcp Per  Admit date: 09/20/2020  Discharge date: 09/26/2020  Admitted From:Home  Disposition:  Home  Recommendations for Outpatient Follow-up:  1. Follow up with PCP in 1-2 weeks, pcp list provided 2. Please obtain BMP/CBC in one week, recommend repeat thyroid function tests in 4-6 weeks 3. Continue on metoprolol and Eliquis as prescribed for new onset atrial fibrillation with RVR-now resolved 4. Follow-up with Dr. Alvy Bimler on 2/15 as scheduled for further evaluation of thrombocytopenia and assistance with management of PE 5. Continue on insulin 70/30 as prescribed 40 units twice daily and monitor home blood glucose twice daily.  Patient has glucometer as well as lancets and strips available.  Home Health: None  Equipment/Devices: None  Discharge Condition:Stable  CODE STATUS: Full  Diet recommendation: Heart Healthy/carb modified  Brief/Interim Summary: Richard Mullins is a 55 y.o. male with limited medical follow up who presented to the ED with several weeks of polyuria, polydipsia, mental clouding, blurry vision who was found to be in DKA with new diagnosis of T2DM. IV insulin and IVF were given with improved glycemic control and metabolic parameters, subsequently converted to basal-bolus insulin. HbA1c found to be >15.5%. Overnight into 2/3, pt developed AFib w/RVR and CTA chest performed revealing right-sided PE for which diltiazem and heparin gtt's were started.    He was not noted to have any RV strain on 2D echocardiogram and was transition to Eliquis which he is now tolerating well.  He has been seen and evaluated by hematology given his thrombocytopenia as well, but this also remained stable and he is in stable condition for discharge with outpatient follow-up scheduled.  He will also now remain on 70/30 insulin 40 units twice daily to assist with blood glucose control.  He is  otherwise in stable condition for discharge with no other acute events noted throughout the course of this admission.  Discharge Diagnoses:  Principal Problem:   DKA, type 2 ([place]) Active Problems:   AKI (acute kidney injury) ([place])   Lactic acidosis   Hypernatremia   Hypercalcemia   Dehydration   Atrial fibrillation with rapid ventricular response (HCC)   Thrombocytopenia (HCC)  Principal discharge diagnosis: DKA in the setting of new diagnosis of type 2 diabetes, acute PE, atrial fibrillation with RVR-new diagnosis in setting of PE.  Discharge Instructions  Discharge Instructions    Diet - low sodium heart healthy   Complete by: As directed    Increase activity slowly   Complete by: As directed      Allergies as of 09/26/2020      Reactions   Microplegia Msa-msg [plegisol]    SWELLING & NAUSEA       Medication List    TAKE these medications   apixaban 5 MG Tabs tablet Commonly known as: ELIQUIS Take 2 tablets (10 mg total) by mouth 2 (two) times daily for 5 days.   apixaban 5 MG Tabs tablet Commonly known as: ELIQUIS Take 1 tablet (5 mg total) by mouth 2 (two) times daily. Start taking on: October 03, 2020   insulin starter kit- pen needles Misc 1 kit by Other route once for 1 dose.   metoprolol tartrate 100 MG tablet Commonly known as: LOPRESSOR Take 1 tablet (100 mg total) by mouth 2 (two) times daily.   NovoLIN 70/30 FlexPen Relion (70-30) 100 UNIT/ML KwikPen Generic drug: insulin isophane & regular human Inject 40 Units into the skin 2 (two)  times daily.   Pen Needles 32G X 5 MM Misc 1 application by Does not apply route 2 (two) times daily.   triamcinolone 0.1 % Commonly known as: KENALOG Apply topically 3 (three) times daily.       Follow-up Information    pcp. Schedule an appointment as soon as possible for a visit in 1 week(s).              Allergies  Allergen Reactions  . Microplegia Msa-Msg [Plegisol]     SWELLING & NAUSEA      Consultations:  Hematology/oncology   Procedures/Studies: CT ANGIO CHEST PE W OR WO CONTRAST  Result Date: 09/22/2020 CLINICAL DATA:  Increased heart rate and shortness of breath. Recent cardioversion. COVID positive EXAM: CT ANGIOGRAPHY CHEST WITH CONTRAST TECHNIQUE: Multidetector CT imaging of the chest was performed using the standard protocol during bolus administration of intravenous contrast. Multiplanar CT image reconstructions and MIPs were obtained to evaluate the vascular anatomy. CONTRAST:  116m OMNIPAQUE IOHEXOL 350 MG/ML SOLN COMPARISON:  None. FINDINGS: Cardiovascular: Branching pulmonary artery filling defects within anterior basal segment right lower lobe and branching in the right upper lobe pulmonary artery. The clot burden is smaller than would be expected to cause right heart dilatation. No cardiomegaly or pericardial effusion. Unremarkable aorta Mediastinum/Nodes: Negative for adenopathy or mass. Lungs/Pleura: Central airways are clear. No failure or lung infarct. Mild atelectasis in the left lower lung. Upper Abdomen: Hepatic steatosis. Punctate left upper pole calculus. Musculoskeletal: No acute or aggressive finding. Review of the MIP images confirms the above findings. Critical Value/emergent results were called by telephone at the time of interpretation on 09/22/2020 at 5:46 am to [facility], who verbally acknowledged these results. IMPRESSION: 1. Positive for lobar and segmental acute pulmonary emboli on the right. 2. Hepatic steatosis. Electronically Signed   By: JMonte FantasiaM.D.   On: 09/22/2020 05:47   DG CHEST PORT 1 VIEW  Result Date: 09/22/2020 CLINICAL DATA:  Atrial fibrillation and shortness of breath EXAM: PORTABLE CHEST 1 VIEW COMPARISON:  09/20/2020 FINDINGS: Cardiac shadow is within normal limits. The lungs are well aerated bilaterally. No focal infiltrate or sizable effusion is seen. No bony abnormality is noted. IMPRESSION: No acute  abnormality noted. Electronically Signed   By: MInez CatalinaM.D.   On: 09/22/2020 03:19   DG Chest Portable 1 View  Result Date: 09/20/2020 CLINICAL DATA:  Shortness of breath EXAM: PORTABLE CHEST 1 VIEW COMPARISON:  None. FINDINGS: Low lung volumes. No consolidation or edema. No pleural effusion or pneumothorax. Cardiomediastinal contours are within normal limits. IMPRESSION: No acute process in the chest. Electronically Signed   By: PMacy MisM.D.   On: 09/20/2020 14:10   DG Abd Portable 1V  Result Date: 09/20/2020 CLINICAL DATA:  Intractable nausea and vomiting. EXAM: PORTABLE ABDOMEN - 1 VIEW COMPARISON:  None. FINDINGS: The bowel gas pattern is normal. No radio-opaque calculi or other significant radiographic abnormality are seen. IMPRESSION: Negative. Electronically Signed   By: CConstance HolsterM.D.   On: 09/20/2020 16:50   ECHOCARDIOGRAM COMPLETE  Result Date: 09/22/2020    ECHOCARDIOGRAM REPORT   Patient Name:   Ladarious Ambers Date of Exam: 09/22/2020 Medical Rec #:  [id number]     Height:       75.0 in Accession #:    [id number]    Weight:       296.0 lb Date of Birth:  111-02-1966     BSA:  2.592 m Patient Age:    10 years       BP:           121/68 mmHg Patient Gender: M              HR:           81 bpm. Exam Location:  Inpatient Procedure: 2D Echo and Intracardiac Opacification Agent Indications:    Atrial Fibrillation I48.91  History:        Patient has no prior history of Echocardiogram examinations.  Sonographer:    Mikki Santee RDCS (AE) Referring Phys: [id number] [place]  1. Poor acoustic windows limit study.  2. Left ventricular ejection fraction, by estimation, is 65 to 70%. The left ventricle has normal function. The left ventricle has no regional wall motion abnormalities. There is not assessed left ventricular hypertrophy. Left ventricular diastolic parameters are consistent with Grade I diastolic dysfunction (impaired relaxation).  3. Right  ventricular systolic function is normal. The right ventricular size is normal.  4. Left atrial size was mildly dilated.  5. The mitral valve is normal in structure. Trivial mitral valve regurgitation.  6. The aortic valve is normal in structure. Aortic valve regurgitation is not visualized.  7. Pulmonic valve regurgitation not assessed.  8. The inferior vena cava normal size. FINDINGS  Left Ventricle: Left ventricular ejection fraction, by estimation, is 65 to 70%. The left ventricle has normal function. The left ventricle has no regional wall motion abnormalities. The left ventricular internal cavity size was normal in size. There is  not assessed left ventricular hypertrophy. Left ventricular diastolic parameters are consistent with Grade I diastolic dysfunction (impaired relaxation). Right Ventricle: The right ventricular size is normal. Right vetricular wall thickness was not assessed. Right ventricular systolic function is normal. Left Atrium: Left atrial size was mildly dilated. Right Atrium: Right atrial size was normal in size. Pericardium: There is no evidence of pericardial effusion. Mitral Valve: The mitral valve is normal in structure. Trivial mitral valve regurgitation. Tricuspid Valve: The tricuspid valve is normal in structure. Tricuspid valve regurgitation is trivial. Aortic Valve: The aortic valve is normal in structure. Aortic valve regurgitation is not visualized. Pulmonic Valve: The pulmonic valve was not well visualized. Pulmonic valve regurgitation not assessed. Aorta: The aortic root is normal in size and structure. Venous: The inferior vena cava normal size. IAS/Shunts: The interatrial septum was not assessed.   Diastology LV e' medial:    6.85 cm/s LV E/e' medial:  8.7 LV e' lateral:   8.38 cm/s LV E/e' lateral: 7.1  RIGHT VENTRICLE RV S prime:     19.60 cm/s TAPSE (M-mode): 2.3 cm LEFT ATRIUM              Index       RIGHT ATRIUM           Index LA diam:        3.10 cm  1.20 cm/m  RA Area:      14.00 cm LA Vol (A2C):   82.8 ml  31.94 ml/m RA Volume:   32.20 ml  12.42 ml/m LA Vol (A4C):   105.0 ml 40.51 ml/m LA Biplane Vol: 100.0 ml 38.58 ml/m  AORTIC VALVE LVOT Vmax:   107.00 cm/s LVOT Vmean:  63.200 cm/s LVOT VTI:    0.224 m MITRAL VALVE MV Area (PHT): 3.17 cm    SHUNTS MV Decel Time: 239 msec    Systemic VTI: 0.22 m MV E velocity: 59.60 cm/s MV  A velocity: 51.00 cm/s MV E/A ratio:  1.17 Dorris Carnes MD Electronically signed by Dorris Carnes MD Signature Date/Time: 09/22/2020/1:45:47 PM    Final       Discharge Exam: Vitals:   09/26/20 0339 09/26/20 0752  BP: (!) 97/46 127/64  Pulse: 65 79  Resp: 18 (!) 22  Temp: 98 F (36.7 C) 98 F (36.7 C)  SpO2: 93%    Vitals:   09/25/20 2126 09/25/20 2325 09/26/20 0339 09/26/20 0752  BP: 121/65 (!) 107/50 (!) 97/46 127/64  Pulse: 70 71 65 79  Resp:  20 18 (!) 22  Temp:  98.1 F (36.7 C) 98 F (36.7 C) 98 F (36.7 C)  TempSrc:  Oral Oral Oral  SpO2:  93% 93%   Weight:      Height:        General: Pt is alert, awake, not in acute distress, obese Cardiovascular: RRR, S1/S2 +, no rubs, no gallops Respiratory: CTA bilaterally, no wheezing, no rhonchi Abdominal: Soft, NT, ND, bowel sounds + Extremities: no edema, no cyanosis    The results of significant diagnostics from this hospitalization (including imaging, microbiology, ancillary and laboratory) are listed below for reference.     Microbiology: Recent Results (from the past 240 hour(s))  SARS CORONAVIRUS 2 (TAT 6-24 HRS) Nasopharyngeal Nasopharyngeal Swab     Status: None   Collection Time: 09/20/20  3:20 PM   Specimen: Nasopharyngeal Swab  Result Value Ref Range Status   SARS Coronavirus 2 NEGATIVE NEGATIVE Final    Comment: (NOTE) SARS-CoV-2 target nucleic acids are NOT DETECTED.  The SARS-CoV-2 RNA is generally detectable in upper and lower respiratory specimens during the acute phase of infection. Negative results do not preclude SARS-CoV-2 infection, do not  rule out co-infections with other pathogens, and should not be used as the sole basis for treatment or other patient management decisions. Negative results must be combined with clinical observations, patient history, and epidemiological information. The expected result is Negative.  Fact Sheet for Patients: SugarRoll.be  Fact Sheet for Healthcare Providers: https://www.woods-mathews.com/  This test is not yet approved or cleared by the [place] FDA and  has been authorized for detection and/or diagnosis of SARS-CoV-2 by FDA under an Emergency Use Authorization (EUA). This EUA will remain  in effect (meaning this test can be used) for the duration of the COVID-19 declaration under Se ction 564(b)(1) of the Act, 21 U.S.C. section 360bbb-3(b)(1), unless the authorization is terminated or revoked sooner.  Performed at [facility], [place] [address] [place], [place] [address]   MRSA PCR Screening     Status: None   Collection Time: 09/22/20  6:03 PM   Specimen: Nasopharyngeal  Result Value Ref Range Status   MRSA by PCR NEGATIVE NEGATIVE Final    Comment:        The GeneXpert MRSA Assay (FDA approved for NASAL specimens only), is one component of a comprehensive MRSA colonization surveillance program. It is not intended to diagnose MRSA infection nor to guide or monitor treatment for MRSA infections. Performed at [facility], [place] [address] [place], [place] [address]      Labs: BNP (last 3 results) No results for input(s): BNP in the last 8760 hours. Basic Metabolic Panel: Recent Labs  Lab 09/21/20 0900 09/22/20 0242 09/23/20 1108 09/24/20 0457 09/25/20 0145 09/26/20 0034  NA 161*  --  142 141 140 139  K 3.4* 3.3* 3.6 3.6 3.4* 3.6  CL 122*  --  110 108 110 109  CO2 24  --  21* 24 20* 22  GLUCOSE 292*  --  312* 186* 153* 175*  BUN 37*  --  19 14 11 10   CREATININE 1.60*  --  0.92 0.90 0.98 1.16  CALCIUM  10.3  --  8.8* 8.3* 8.4* 8.6*  MG  --  2.4 2.1 2.0  --   --    Liver Function Tests: Recent Labs  Lab 09/20/20 1010 09/25/20 0145  AST 32 55*  ALT 54* 59*  ALKPHOS 65 42  BILITOT 2.2* 1.0  PROT 8.5* 5.6*  ALBUMIN 4.0 2.5*   Recent Labs  Lab 09/20/20 1010  LIPASE 48   No results for input(s): AMMONIA in the last 168 hours. CBC: Recent Labs  Lab 09/20/20 1010 09/20/20 1449 09/23/20 0109 09/24/20 0457 09/25/20 0145 09/26/20 0034  WBC 12.5*  --  10.5 5.6 5.6 5.4  NEUTROABS 9.8*  --   --   --   --   --   HGB 18.6* 18.7* 14.6 13.6 13.2 14.0  HCT 59.6* 55.0* 42.1 40.8 38.3* 41.2  MCV 100.0  --  95.7 94.0 92.1 93.4  PLT 296  --  110* 87* 89* 101*   Cardiac Enzymes: No results for input(s): CKTOTAL, CKMB, CKMBINDEX, TROPONINI in the last 168 hours. BNP: Invalid input(s): POCBNP CBG: Recent Labs  Lab 09/25/20 0618 09/25/20 1127 09/25/20 1539 09/25/20 2111 09/26/20 0606  GLUCAP 153* 256* 152* 145* 218*   D-Dimer No results for input(s): DDIMER in the last 72 hours. Hgb A1c No results for input(s): HGBA1C in the last 72 hours. Lipid Profile No results for input(s): CHOL, HDL, LDLCALC, TRIG, CHOLHDL, LDLDIRECT in the last 72 hours. Thyroid function studies No results for input(s): TSH, T4TOTAL, T3FREE, THYROIDAB in the last 72 hours.  Invalid input(s): FREET3 Anemia work up No results for input(s): VITAMINB12, FOLATE, FERRITIN, TIBC, IRON, RETICCTPCT in the last 72 hours. Urinalysis    Component Value Date/Time   COLORURINE YELLOW 09/20/2020 1603   APPEARANCEUR CLEAR 09/20/2020 1603   LABSPEC 1.015 09/20/2020 1603   PHURINE 5.5 09/20/2020 1603   GLUCOSEU >=500 (A) 09/20/2020 1603   HGBUR SMALL (A) 09/20/2020 1603   BILIRUBINUR NEGATIVE 09/20/2020 1603   KETONESUR 15 (A) 09/20/2020 1603   PROTEINUR NEGATIVE 09/20/2020 1603   NITRITE NEGATIVE 09/20/2020 1603   LEUKOCYTESUR NEGATIVE 09/20/2020 1603   Sepsis Labs Invalid input(s): PROCALCITONIN,  WBC,   LACTICIDVEN Microbiology Recent Results (from the past 240 hour(s))  SARS CORONAVIRUS 2 (TAT 6-24 HRS) Nasopharyngeal Nasopharyngeal Swab     Status: None   Collection Time: 09/20/20  3:20 PM   Specimen: Nasopharyngeal Swab  Result Value Ref Range Status   SARS Coronavirus 2 NEGATIVE NEGATIVE Final    Comment: (NOTE) SARS-CoV-2 target nucleic acids are NOT DETECTED.  The SARS-CoV-2 RNA is generally detectable in upper and lower respiratory specimens during the acute phase of infection. Negative results do not preclude SARS-CoV-2 infection, do not rule out co-infections with other pathogens, and should not be used as the sole basis for treatment or other patient management decisions. Negative results must be combined with clinical observations, patient history, and epidemiological information. The expected result is Negative.  Fact Sheet for Patients: SugarRoll.be  Fact Sheet for Healthcare Providers: https://www.woods-mathews.com/  This test is not yet approved or cleared by the [place] FDA and  has been authorized for detection and/or diagnosis of SARS-CoV-2 by FDA under an Emergency Use Authorization (EUA). This EUA  will remain  in effect (meaning this test can be used) for the duration of the COVID-19 declaration under Se ction 564(b)(1) of the Act, 21 U.S.C. section 360bbb-3(b)(1), unless the authorization is terminated or revoked sooner.  Performed at [facility], [place] [address], [place], [place] [address]   MRSA PCR Screening     Status: None   Collection Time: 09/22/20  6:03 PM   Specimen: Nasopharyngeal  Result Value Ref Range Status   MRSA by PCR NEGATIVE NEGATIVE Final    Comment:        The GeneXpert MRSA Assay (FDA approved for NASAL specimens only), is one component of a comprehensive MRSA colonization surveillance program. It is not intended to diagnose MRSA infection nor to guide or monitor treatment  for MRSA infections. Performed at [facility], [place] [address] [place], [place] [address]      Time coordinating discharge: 35 minutes  SIGNED:   Rodena Goldmann, DO [facility] 09/26/2020, 10:50 AM  If 7PM-7AM, please contact night-coverage [email or web address]

## 2020-09-26 NOTE — TOC Transition Note (Signed)
Transition of Care [facility]) - CM/SW Discharge Note   Patient Details  Name: Richard Mullins MRN: [id number] Date of Birth: 1966-06-09  [facility]) CM/SW Contact:  Leone Haven, RN Phone Number: 09/26/2020, 12:13 PM   Clinical Narrative:    Patient is for dc today, he does not have insurance or PCP.  He has follow up with the [facility], TOC will be filling medications, NCM assisted him with the Match Letter.  He also has the patient ast application for the eliquis,  Informed him he will need to get the eliquis refill from [facility] for 10.00 while he is waiting on patient assistance to go thru.    Final next level of care: Home/Self Care Barriers to Discharge: No Barriers Identified   Patient Goals and CMS Choice Patient states their goals for this hospitalization and ongoing recovery are:: get better   Choice offered to / list presented to : NA  Discharge Placement                       Discharge Plan and Services                  DME Agency: NA       HH Arranged: NA          Social Determinants of Health (SDOH) Interventions     Readmission Risk Interventions No flowsheet data found.

## 2020-09-26 NOTE — Discharge Instructions (Signed)
Information on my medicine - ELIQUIS (apixaban)  This medication education was reviewed with me or my healthcare representative as part of my discharge preparation.  The pharmacist that spoke with me during my hospital stay was:  Mosetta Anis, [facility]  Why was Eliquis prescribed for you? Eliquis was prescribed to treat blood clots that may have been found in the veins of your legs (deep vein thrombosis) or in your lungs (pulmonary embolism) and to reduce the risk of them occurring again.  What do You need to know about Eliquis ? The starting dose is 10 mg (two 5 mg tablets) taken TWICE daily for the FIRST SEVEN (7) DAYS, then on (enter date)  09/30/20  the dose is reduced to ONE 5 mg tablet taken TWICE daily.  Eliquis may be taken with or without food.   Try to take the dose about the same time in the morning and in the evening. If you have difficulty swallowing the tablet whole please discuss with your pharmacist how to take the medication safely.  Take Eliquis exactly as prescribed and DO NOT stop taking Eliquis without talking to the doctor who prescribed the medication.  Stopping may increase your risk of developing a new blood clot.  Refill your prescription before you run out.  After discharge, you should have regular check-up appointments with your healthcare provider that is prescribing your Eliquis.    What do you do if you miss a dose? If a dose of ELIQUIS is not taken at the scheduled time, take it as soon as possible on the same day and twice-daily administration should be resumed. The dose should not be doubled to make up for a missed dose.  Important Safety Information A possible side effect of Eliquis is bleeding. You should call your healthcare provider right away if you experience any of the following: ? Bleeding from an injury or your nose that does not stop. ? Unusual colored urine (red or dark brown) or unusual colored stools (red or black). ? Unusual bruising  for unknown reasons. ? A serious fall or if you hit your head (even if there is no bleeding).  Some medicines may interact with Eliquis and might increase your risk of bleeding or clotting while on Eliquis. To help avoid this, consult your healthcare provider or pharmacist prior to using any new prescription or non-prescription medications, including herbals, vitamins, non-steroidal anti-inflammatory drugs (NSAIDs) and supplements.  This website has more information on Eliquis (apixaban): http://www.eliquis.com/eliquis/home

## 2020-09-29 ENCOUNTER — Telehealth: Payer: Self-pay | Admitting: Pharmacist

## 2020-09-29 NOTE — Telephone Encounter (Signed)
Transitions of Care Pharmacy   Call attempted for a pharmacy transitions of care follow-up. HIPAA appropriate voicemail was left with call back information provided.   Call attempt #1. Will follow-up in 1 day.    

## 2020-09-30 ENCOUNTER — Telehealth: Payer: Self-pay | Admitting: Pharmacist

## 2020-09-30 NOTE — Telephone Encounter (Signed)
Pharmacy Transitions of Care Follow-up Telephone Call  Date of discharge: 09/26/20 How have you been since you were released from the hospital? Patient doing well with med regimen Medication changes made at discharge: Eliquis 5mg   Medication changes obtained and verified? yes    Medication Accessibility:  Home Pharmacy: will be using [facility]  Was the patient provided with refills on discharged medications? yes   Have all prescriptions been transferred from [facility] to home pharmacy? Will transfer when he needs filled to [facility] outpatient  Is the patient able to afford medications? no . Notable copays: [facility] copays . Eligible patient assistance: yes    Medication Review:  APIXABEN (ELIQUIS)  Apixaban 10 mg BID initiated. Will switch to apixaban 5 mg BID after 7 days (10/01/20).  - Discussed importance of taking medication around the same time everyday  - Reviewed potential DDIs with patient  - Advised patient of medications to avoid (NSAIDs, ASA)  - Educated that Tylenol (acetaminophen) will be the preferred analgesic to prevent risk of bleeding  - Emphasized importance of monitoring for signs and symptoms of bleeding (abnormal bruising, prolonged bleeding, nose bleeds, bleeding from gums, discolored urine, black tarry stools)  - Advised patient to alert all providers of anticoagulation therapy prior to starting a new medication or having a procedure   Follow-up Appointments:  PCP Hospital f/u appt confirmed?  Scheduled to see 11/29/20 10/20/20 @210pm .   Specialist Hospital f/u appt confirmed?  Scheduled to see Ni 12/20/20 10/04/20.   If their condition worsens, is the pt aware to call PCP or go to the Emergency Dept.? yes  Final Patient Assessment:

## 2020-10-04 ENCOUNTER — Other Ambulatory Visit: Payer: Self-pay

## 2020-10-04 ENCOUNTER — Encounter: Payer: Self-pay | Admitting: Hematology and Oncology

## 2020-10-04 ENCOUNTER — Inpatient Hospital Stay: Payer: Self-pay | Attending: Hematology and Oncology | Admitting: Hematology and Oncology

## 2020-10-04 ENCOUNTER — Inpatient Hospital Stay: Payer: Self-pay

## 2020-10-04 ENCOUNTER — Other Ambulatory Visit: Payer: Self-pay | Admitting: Hematology and Oncology

## 2020-10-04 VITALS — BP 139/68 | HR 67 | Temp 98.4°F | Resp 18 | Ht 75.0 in

## 2020-10-04 DIAGNOSIS — N179 Acute kidney failure, unspecified: Secondary | ICD-10-CM

## 2020-10-04 DIAGNOSIS — Z794 Long term (current) use of insulin: Secondary | ICD-10-CM | POA: Insufficient documentation

## 2020-10-04 DIAGNOSIS — D696 Thrombocytopenia, unspecified: Secondary | ICD-10-CM

## 2020-10-04 DIAGNOSIS — I2699 Other pulmonary embolism without acute cor pulmonale: Secondary | ICD-10-CM

## 2020-10-04 DIAGNOSIS — E119 Type 2 diabetes mellitus without complications: Secondary | ICD-10-CM

## 2020-10-04 DIAGNOSIS — Z86711 Personal history of pulmonary embolism: Secondary | ICD-10-CM | POA: Insufficient documentation

## 2020-10-04 LAB — CBC WITH DIFFERENTIAL/PLATELET
Abs Immature Granulocytes: 0.03 10*3/uL (ref 0.00–0.07)
Basophils Absolute: 0 10*3/uL (ref 0.0–0.1)
Basophils Relative: 1 %
Eosinophils Absolute: 0.1 10*3/uL (ref 0.0–0.5)
Eosinophils Relative: 1 %
HCT: 42.6 % (ref 39.0–52.0)
Hemoglobin: 14.5 g/dL (ref 13.0–17.0)
Immature Granulocytes: 1 %
Lymphocytes Relative: 35 %
Lymphs Abs: 2.2 10*3/uL (ref 0.7–4.0)
MCH: 31.3 pg (ref 26.0–34.0)
MCHC: 34 g/dL (ref 30.0–36.0)
MCV: 91.8 fL (ref 80.0–100.0)
Monocytes Absolute: 0.6 10*3/uL (ref 0.1–1.0)
Monocytes Relative: 10 %
Neutro Abs: 3.4 10*3/uL (ref 1.7–7.7)
Neutrophils Relative %: 52 %
Platelets: 269 10*3/uL (ref 150–400)
RBC: 4.64 MIL/uL (ref 4.22–5.81)
RDW: 12.7 % (ref 11.5–15.5)
WBC: 6.3 10*3/uL (ref 4.0–10.5)
nRBC: 0 % (ref 0.0–0.2)

## 2020-10-04 LAB — BASIC METABOLIC PANEL - CANCER CENTER ONLY
Anion gap: 7 (ref 5–15)
BUN: 7 mg/dL (ref 6–20)
CO2: 22 mmol/L (ref 22–32)
Calcium: 9.7 mg/dL (ref 8.9–10.3)
Chloride: 106 mmol/L (ref 98–111)
Creatinine: 1.02 mg/dL (ref 0.61–1.24)
GFR, Estimated: >60 mL/min (ref >60)
Glucose, Bld: 124 mg/dL — ABNORMAL HIGH (ref 70–99)
Potassium: 3.8 mmol/L (ref 3.5–5.1)
Sodium: 135 mmol/L (ref 135–145)

## 2020-10-04 MED ORDER — NOVOLIN 70/30 FLEXPEN RELION (70-30) 100 UNIT/ML ~~LOC~~ SUPN: 35.0000 [IU] | PEN_INJECTOR | Freq: Two times a day (BID) | SUBCUTANEOUS | 11 refills | Status: DC

## 2020-10-04 NOTE — Assessment & Plan Note (Signed)
His renal function has improved He will continue aggressive hydration as tolerated

## 2020-10-04 NOTE — Assessment & Plan Note (Signed)
His recent thrombocytopenia noted in the hospital has resolved He does not need further work-up or follow-up in this regard

## 2020-10-04 NOTE — Assessment & Plan Note (Signed)
He is doing well  His document of blood sugar is now low I recommend reducing his insulin to 35 units twice a day He has appointment scheduled early next month to see his new primary care doctor I encouraged him to continue on aggressive lifestyle changes

## 2020-10-04 NOTE — Progress Notes (Signed)
[name] OFFICE PROGRESS NOTE  Patient, No Pcp Per  ASSESSMENT & PLAN:  Thrombocytopenia (HCC) His recent thrombocytopenia noted in the hospital has resolved He does not need further work-up or follow-up in this regard  Acute pulmonary embolism without acute cor pulmonale (HCC) He denies chest pain or shortness of breath He tolerated apixaban well He will continue the same Duration of anticoagulation therapy would be at least 6 months to a year I plan to see him again in 6 months for further follow-up  AKI (acute kidney injury) (HCC) His renal function has improved He will continue aggressive hydration as tolerated  Type 2 diabetes mellitus without complication, with long-term current use of insulin (HCC) He is doing well  His document of blood sugar is now low I recommend reducing his insulin to 35 units twice a day He has appointment scheduled early next month to see his new primary care doctor I encouraged him to continue on aggressive lifestyle changes   Orders Placed This Encounter  Procedures  . Basic Metabolic Panel - Cancer Center Only    Standing Status:   Standing    Number of Occurrences:   3    Standing Expiration Date:   10/04/2021    The total time spent in the appointment was 20 minutes encounter with patients including review of chart and various tests results, discussions about plan of care and coordination of care plan   All questions were answered. The patient knows to call the clinic with any problems, questions or concerns. No barriers to learning was detected.    Artis Delay, MD 2/15/20223:14 PM  INTERVAL HISTORY: Richard Mullins 55 y.o. male returns for hospital follow-up Since last time I saw him, he is doing well He has several episodes of hallucination, brief episodes, With changes in his vision intermittently He is documented blood sugar has been low, occasionally less than 100 He has avoided sweetened beverages and drink  approximately 3 L of water per day He denies recent chest pain or shortness of breath The patient denies any recent signs or symptoms of bleeding such as spontaneous epistaxis, hematuria or hematochezia.  SUMMARY OF HEMATOLOGIC HISTORY: Please see my original consult note dated 09/25/2020 for further details Richard Mullins was seen here because of thrombocytopenia and recent diagnosis of PE  The patient is a reasonable historian He lives alone by himself, single with no children He worked part-time with a Forensic scientist He does not have a primary care doctor and has not seen physician for some time He is otherwise healthy  He has not been feeling well since August of last year with feeling unwell, blurriness of vision and nonspecific weight loss He also have frequent urination In 2019, he had diagnosis of kidney stone He admits to poor dietary choices and drink plenty of soda, sweetened beverages and juices He complains of fatigue He denies chest pain or shortness of breath but felt so poorly to the point that he presented to the emergency department several days ago He was found to have uncontrolled diabetes, atrial fibrillation as well as pulmonary embolism We do not have baseline blood work but on admission on 09/20/2020, he has elevated white blood cell count, hemoglobin of 18.6 and a platelet count of 296,000 CT imaging show PE as well as hepatic steatosis He was placed on aggressive insulin regimen for uncontrolled hyperglycemia His first measure blood sugar was over 1000 and he presented with signs of acute kidney injury, hypercalcemia and liver  failure  After aggressive management and hydration, his hemoglobin is now 13.2 but he has platelet count trending down to slightly under 100,000.  His liver failure is almost back to normal His serum creatinine has normalized and his calcium are now normal He underwent echocardiogram which showed preserved ejection fraction On  monitor today, he appears to be in sinus rhythm He was originally placed on IV heparin but then transitioned to Eliquis  Due to low platelet count, I was consulted for evaluation He has no signs of bleeding since admission  In the background, he has occasional intermittent right-sided nosebleed and occasional bright red blood per rectum He has not had colonoscopy due to lack of insurance  He denies prior blood or platelet transfusions He had minor surgery with wisdom tooth extraction in the past He has no prior history of blood clots He has no family history of blood clots He denies testosterone use He is not a smoker The patient stated he does not own a car and typically either walk or bicycle to grocery stores for supplies He is noted to have chronic bilateral lower extremity edema which he attributed to other causes He denies trauma to his lower extremities The patient was subsequently discharged home on Eliquis I have reviewed the past medical history, past surgical history, social history and family history with the patient and they are unchanged from previous note.  ALLERGIES:  is allergic to microplegia msa-msg [plegisol].  MEDICATIONS:  Current Outpatient Medications  Medication Sig Dispense Refill  . apixaban (ELIQUIS) 5 MG TABS tablet Take 1 tablet (5 mg total) by mouth 2 (two) times daily. 60 tablet 3  . insulin isophane & regular human (NOVOLIN 70/30 FLEXPEN RELION) (70-30) 100 UNIT/ML KwikPen Inject 35 Units into the skin 2 (two) times daily. 15 mL 11  . Insulin Pen Needle (PEN NEEDLES) 32G X 5 MM MISC 1 application by Does not apply route 2 (two) times daily. 90 each 5  . metoprolol tartrate (LOPRESSOR) 100 MG tablet Take 1 tablet (100 mg total) by mouth 2 (two) times daily. 60 tablet 2  . triamcinolone (KENALOG) 0.1 % Apply topically 3 (three) times daily. 30 g 0   No current facility-administered medications for this visit.     REVIEW OF SYSTEMS:    Constitutional: Denies fevers, chills or night sweats Ears, nose, mouth, throat, and face: Denies mucositis or sore throat Respiratory: Denies cough, dyspnea or wheezes Cardiovascular: Denies palpitation, chest discomfort or lower extremity swelling Gastrointestinal:  Denies nausea, heartburn or change in bowel habits Skin: Denies abnormal skin rashes Lymphatics: Denies new lymphadenopathy or easy bruising Neurological:Denies numbness, tingling or new weaknesses Behavioral/Psych: Mood is stable, no new changes  All other systems were reviewed with the patient and are negative.  PHYSICAL EXAMINATION: ECOG PERFORMANCE STATUS: 1 - Symptomatic but completely ambulatory  Vitals:   10/04/20 1231  BP: 139/68  Pulse: 67  Resp: 18  Temp: 98.4 F (36.9 C)  SpO2: 99%   There were no vitals filed for this visit.  GENERAL:alert, no distress and comfortable NEURO: alert & oriented x 3 with fluent speech, no focal motor/sensory deficits  LABORATORY DATA:  I have reviewed the data as listed     Component Value Date/Time   NA 135 10/04/2020 1305   K 3.8 10/04/2020 1305   CL 106 10/04/2020 1305   CO2 22 10/04/2020 1305   GLUCOSE 124 (H) 10/04/2020 1305   BUN 7 10/04/2020 1305   CREATININE 1.02 10/04/2020 1305  CALCIUM 9.7 10/04/2020 1305   PROT 5.6 (L) 09/25/2020 0145   ALBUMIN 2.5 (L) 09/25/2020 0145   AST 55 (H) 09/25/2020 0145   ALT 59 (H) 09/25/2020 0145   ALKPHOS 42 09/25/2020 0145   BILITOT 1.0 09/25/2020 0145   GFRNONAA >60 10/04/2020 1305    No results found for: SPEP, UPEP  Lab Results  Component Value Date   WBC 6.3 10/04/2020   NEUTROABS 3.4 10/04/2020   HGB 14.5 10/04/2020   HCT 42.6 10/04/2020   MCV 91.8 10/04/2020   PLT 269 10/04/2020      Chemistry      Component Value Date/Time   NA 135 10/04/2020 1305   K 3.8 10/04/2020 1305   CL 106 10/04/2020 1305   CO2 22 10/04/2020 1305   BUN 7 10/04/2020 1305   CREATININE 1.02 10/04/2020 1305       Component Value Date/Time   CALCIUM 9.7 10/04/2020 1305   ALKPHOS 42 09/25/2020 0145   AST 55 (H) 09/25/2020 0145   ALT 59 (H) 09/25/2020 0145   BILITOT 1.0 09/25/2020 0145

## 2020-10-04 NOTE — Progress Notes (Signed)
Received referral from [facility] regarding Medicaid process for uninsured.  Sent staff message to RN and provider regarding screening for Medicaid and follow up from recent hospital inpatient visit from account notes.  Called patient to advise and also emailed him a [employer] application and explained how to submit. He verbalized understanding.

## 2020-10-04 NOTE — Assessment & Plan Note (Signed)
He denies chest pain or shortness of breath He tolerated apixaban well He will continue the same Duration of anticoagulation therapy would be at least 6 months to a year I plan to see him again in 6 months for further follow-up

## 2020-10-05 ENCOUNTER — Telehealth: Payer: Self-pay | Admitting: Hematology and Oncology

## 2020-10-05 NOTE — Telephone Encounter (Signed)
Scheduled appt per 2/15 sch msg - mailed letter with appt date and time   

## 2020-10-19 ENCOUNTER — Telehealth: Payer: Self-pay | Admitting: General Practice

## 2020-10-19 ENCOUNTER — Inpatient Hospital Stay: Payer: Self-pay | Admitting: Physician Assistant

## 2020-10-19 NOTE — Telephone Encounter (Signed)
Called patient and LVm apologizing for the late notice but that the provider would not be in the office tomorrow and his appointment needs to be rescheduled. Advised patient to call [phone] to schedule.

## 2020-10-20 ENCOUNTER — Inpatient Hospital Stay: Payer: Self-pay | Admitting: Physician Assistant

## 2020-10-20 ENCOUNTER — Telehealth: Payer: Self-pay | Admitting: General Practice

## 2020-10-20 NOTE — Telephone Encounter (Signed)
Refer pt to Mobile unit or another one of our clinics for an earlier appointment if he can not wait.

## 2020-10-20 NOTE — Telephone Encounter (Signed)
Copied from CRM [id number]. Topic: Appointment Scheduling - Scheduling Inquiry for Clinic >> Oct 19, 2020 12:40 PM Wyonia Hough E wrote: Reason for CRM: Pt has had his Hosp follow up cancelled for today and tomorrow / the next available in 3.31.22 but pt stated he will need his blood thinner before then and he is on insulin which needs to be checked to see if dose needs to be tapered or changed/ pt is concern that 3.31.22 is too far out / please advise   Can I take TOC slot on Monday with [place] for patient?

## 2020-10-21 NOTE — Telephone Encounter (Signed)
Scheduled at [facility].

## 2020-10-27 ENCOUNTER — Telehealth (INDEPENDENT_AMBULATORY_CARE_PROVIDER_SITE_OTHER): Payer: Self-pay | Admitting: Primary Care

## 2020-10-27 DIAGNOSIS — Z09 Encounter for follow-up examination after completed treatment for conditions other than malignant neoplasm: Secondary | ICD-10-CM

## 2020-10-27 DIAGNOSIS — Z794 Long term (current) use of insulin: Secondary | ICD-10-CM

## 2020-10-27 DIAGNOSIS — E119 Type 2 diabetes mellitus without complications: Secondary | ICD-10-CM

## 2020-10-27 NOTE — Progress Notes (Signed)
Telephone Note  I connected with [name] on 10/27/20 at  2:30 PM EST by telephone and verified that I am speaking with the correct person using two identifiers.  Location: Patient: Home  Provider: Grayce Sessions @[facility]    I discussed the limitations, risks, security and privacy concerns of performing an evaluation and management service by telephone and the availability of in person appointments. I also discussed with the patient that there may be a patient responsible charge related to this service. The patient expressed understanding and agreed to proceed.   History of Present Illness: Richard Mullins is a 55 y.o.male presents for follow up from the hospital. Admit date to the hospital was 09/20/20, patient was discharged from the hospital on 09/26/20, patient was admitted for: DKA, type 2 (HCC) New onset type 2 diabetes mellitus. Lactic acid elevation. AKI.  Patient does have polyuria and vision changes.  Denies polydipsia or polyphagia.  This is only a hospital follow-up patient request to be followed by community [facility] wellness closer to home and easier to make appointments.  Explained he will need a A1c in May and have secretary to schedule appointment for follow-up.  Observations/Objective: Vital signs not taken Pertinent positives and negatives noted in  Assessment and Plan: Diagnoses and all orders for this visit:  Hospital discharge follow-up New diagnosis of type 2 diabetes however he had noticed his symptoms of increased urination and hunger around Christmas and ignore the symptoms and signs.  He will need a lipid panel discussed it with him and follow-up with PCP at [facility] in May if available.  Due to location and transportation  Type 2 diabetes mellitus without complication, with long-term current use of insulin (HCC) A1c in the hospital was greater than 15.Goal of therapy: Less than 6.5 hemoglobin A1c.  Foods that are high in carbohydrates are  the following rice, potatoes, breads, sugars, and pastas.  Reduction in the intake (eating) will assist in lowering your blood sugars.   Follow Up Instructions:    I discussed the assessment and treatment plan with the patient. The patient was provided an opportunity to ask questions and all were answered. The patient agreed with the plan and demonstrated an understanding of the instructions.   The patient was advised to call back or seek an in-person evaluation if the symptoms worsen or if the condition fails to improve as anticipated.  I provided 14 minutes of non-face-to-face time during this encounter.  Reviewing hospital documentation labs and imaging   June, NP

## 2020-12-11 ENCOUNTER — Encounter: Payer: Self-pay | Admitting: Nurse Practitioner

## 2020-12-14 ENCOUNTER — Ambulatory Visit: Payer: Self-pay | Admitting: Nurse Practitioner

## 2021-01-04 NOTE — Progress Notes (Signed)
Careteam: Patient Care Team: Patient, No Pcp Per (Inactive) as PCP - General (General Practice) Artis Delay, MD as Consulting Physician (Hematology and Oncology)  Seen by: Hazle Nordmann, AGNP-C  PLACE OF SERVICE:  [facility]  Advanced Directive information    Allergies  Allergen Reactions  . Monosodium Glutamate Diarrhea, Hives and Nausea And Vomiting    No chief complaint on file.    HPI: Patient is a 55 y.o. male seen today to establish at [facility].   Previous PCP: Did not have previous provider. Lived in [place] for about 6 years. Originally from [place].   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   Covid: he believes he had covid in January, 2022. Loss of taste and smell. Had [employer] and [employer] vaccination. No hospitalization for covid.   Diabetes: symptoms began november 2021. He had frequent urination. Hospitalized in February 2022, he was diagnosed. A1C > 15. He was started on 70/30 insulin, 35 units daily. Checks his sugar about 10 times a day. Reports following low carb diet. No hypoglycemic events.   Atrial fibrillation with RVR: diagnosed February 2022. Takes metoprolol daily. Denies palpitations or fatigue.   Pulmonary embolism: found in right lung during  February 2022 hospitalization. Believed to be related to J&J vaccine. Takes Eliquis 5 mg bid. Followed by Dr. Bertis Ruddy, he is scheduled to see her in August, 2022. Denies sob or chest pain.   Psoriasis: located on both legs. He has also had it on his arms in past. He puts aloe vera on rash when it flares. Will also use triamcinolone cream prn for bad flares. Rash does have some itch at times.   GERD: began a couple years ago. He went to the ED for chest pain. He sleeps with his head slightly elevated. Denies reflux after changing his diet.   Overweight: use to weight 350 lbs in the past. Since being diagnosed with diabetes, he is trying to lose weight by following a  low carb diet and exercising daily.   Insomnia- has had for last 10 years. Averages about 6 hours of sleep per night. Admits to looking at phone during the night.   Past surgeries:  Wisdom teeth removal- 2010  Ingrown toenail- 1995  Past Hospitalizations:  09/2020- Diabetes  2008- Flu  Family history:  Father- deceased- lung cancer  Mother- living, age 35  Diet: follows low carb since diabetes diagnosis. Averaging about 2500 carlories daily. Eats protein, fruits and vebetables daily. Will have semi-sweet chocolate chips for sweet tooth.   Exercise: Likes bicycling 10 miles daily.   Smoking: never smoked  Alcohol: reports one drink per week  Eye: wears glasses. Discussed importance of eye exams and diabetes.   Dentist: recently had dental cleaning.   Colonoscopy: never done. He does not want to have procedure done until his diabetes is managed. Older sister had colon cancer. Denies changes in stool and bowel habits.   Advanced directives: packet given.   Review of Systems:  Review of Systems  Constitutional: Negative for chills, fever and malaise/fatigue.  HENT: Positive for hearing loss.        High pitched sounds, dry mouth  Eyes: Negative.   Respiratory: Negative for cough, shortness of breath and wheezing.   Cardiovascular: Negative for chest pain, palpitations and leg swelling.  Gastrointestinal: Positive for heartburn. Negative for abdominal pain, blood in stool, constipation, diarrhea, melena, nausea and vomiting.       Hemorrhoids  Genitourinary: Positive for  frequency. Negative for dysuria.  Musculoskeletal: Positive for myalgias.       Ankle pain  Skin: Positive for itching.       Dry skin  Neurological: Positive for weakness. Negative for dizziness and headaches.  Psychiatric/Behavioral: Negative for depression. The patient is nervous/anxious and has insomnia.     Past Medical History:  Diagnosis Date  . Atrial fibrillation (HCC) 09/20/2020  .  Complication of anesthesia    DURING DENTAL PROCEDURE  . Diabetes mellitus without complication (HCC)   . Gastroesophageal reflux disease   . History of blood clots 09/20/2020  . History of COVID-19 2022  . PONV (postoperative nausea and vomiting)   . Psoriasis    Past Surgical History:  Procedure Laterality Date  . ingrown toenails  1995  . WISDOM TOOTH EXTRACTION     Social History:   reports that he has never smoked. He has never used smokeless tobacco. He reports current alcohol use. He reports previous drug use.  Family History  Problem Relation Age of Onset  . Lung cancer Father   . Colon cancer Sister   . Heart attack Maternal Uncle 70  . High blood pressure Sister   . ADD / ADHD Sister   . Diabetes Sister     Medications: Patient's Medications  New Prescriptions   No medications on file  Previous Medications   ACETAMINOPHEN (TYLENOL) 500 MG TABLET    Take 500 mg by mouth as needed.   APIXABAN (ELIQUIS) 5 MG TABS TABLET    TAKE 2 TABLETS (10 MG TOTAL) BY MOUTH TWO TIMES DAILY FOR 5 DAYS. THEN TAKE 1 TABLET (5 MG) TWICE DAILY   CLOTRIMAZOLE (LOTRIMIN) 1 % CREAM    Apply 1 application topically as needed (for foot fungus).   DIPHENHYDRAMINE (BENADRYL) 25 MG TABLET    Take 25 mg by mouth 2 (two) times daily.   INSULIN ISOPHANE & REGULAR HUMAN (NOVOLIN 70/30 FLEXPEN RELION) (70-30) 100 UNIT/ML KWIKPEN    Inject 35 Units into the skin 2 (two) times daily.   INSULIN PEN NEEDLE (PEN NEEDLES) 32G X 5 MM MISC    1 application by Does not apply route 2 (two) times daily.   LOPERAMIDE (IMODIUM) 2 MG CAPSULE    Take by mouth as needed for diarrhea or loose stools.   METOPROLOL TARTRATE (LOPRESSOR) 100 MG TABLET    Take 1 tablet (100 mg total) by mouth 2 (two) times daily.   MULTIPLE VITAMIN (MULTIVITAMIN ADULT PO)    Take by mouth.   TRIAMCINOLONE (KENALOG) 0.1 %    Apply topically 3 (three) times daily.  Modified Medications   No medications on file  Discontinued Medications    No medications on file    Physical Exam:  There were no vitals filed for this visit. There is no height or weight on file to calculate BMI. Wt Readings from Last 3 Encounters:  09/20/20 296 lb (134.3 kg)  03/26/18 (!) 350 lb (158.8 kg)    Physical Exam Vitals reviewed.  Constitutional:      General: He is not in acute distress.    Appearance: He is obese.  HENT:     Head: Normocephalic.  Neck:     Thyroid: No thyroid mass or thyromegaly.  Cardiovascular:     Rate and Rhythm: Normal rate and regular rhythm.     Pulses: Normal pulses.     Heart sounds: Normal heart sounds. No murmur heard.   Pulmonary:     Effort: Pulmonary  effort is normal. No respiratory distress.     Breath sounds: Normal breath sounds. No wheezing.  Abdominal:     General: Bowel sounds are normal. There is no distension.     Palpations: Abdomen is soft.     Tenderness: There is no abdominal tenderness.  Musculoskeletal:     Cervical back: Normal range of motion.     Right lower leg: No edema.     Left lower leg: No edema.  Lymphadenopathy:     Cervical: No cervical adenopathy.  Skin:    General: Skin is warm and dry.     Capillary Refill: Capillary refill takes less than 2 seconds.     Comments: Dry flaking skin on bilateral shins, skin discolored.   Neurological:     General: No focal deficit present.     Mental Status: He is alert and oriented to person, place, and time.  Psychiatric:        Mood and Affect: Mood normal.        Behavior: Behavior normal.    Labs reviewed: Basic Metabolic Panel: Recent Labs    09/22/20 0242 09/22/20 0243 09/23/20 1108 09/24/20 0457 09/25/20 0145 09/26/20 0034 10/04/20 1305  NA  --   --  142 141 140 139 135  K 3.3*  --  3.6 3.6 3.4* 3.6 3.8  CL  --   --  110 108 110 109 106  CO2  --   --  21* 24 20* 22 22  GLUCOSE  --   --  312* 186* 153* 175* 124*  BUN  --   --  19 14 11 10 7   CREATININE  --   --  0.92 0.90 0.98 1.16 1.02  CALCIUM  --   --   8.8* 8.3* 8.4* 8.6* 9.7  MG 2.4  --  2.1 2.0  --   --   --   TSH  --  0.250*  --   --   --   --   --    Liver Function Tests: Recent Labs    09/20/20 1010 09/25/20 0145  AST 32 55*  ALT 54* 59*  ALKPHOS 65 42  BILITOT 2.2* 1.0  PROT 8.5* 5.6*  ALBUMIN 4.0 2.5*   Recent Labs    09/20/20 1010  LIPASE 48   No results for input(s): AMMONIA in the last 8760 hours. CBC: Recent Labs    09/20/20 1010 09/20/20 1449 09/25/20 0145 09/26/20 0034 10/04/20 1305  WBC 12.5*   < > 5.6 5.4 6.3  NEUTROABS 9.8*  --   --   --  3.4  HGB 18.6*   < > 13.2 14.0 14.5  HCT 59.6*   < > 38.3* 41.2 42.6  MCV 100.0   < > 92.1 93.4 91.8  PLT 296   < > 89* 101* 269   < > = values in this interval not displayed.   Lipid Panel: No results for input(s): CHOL, HDL, LDLCALC, TRIG, CHOLHDL, LDLDIRECT in the last 8760 hours. TSH: Recent Labs    09/22/20 0243  TSH 0.250*   A1C: Lab Results  Component Value Date   HGBA1C >15.5 (H) 09/20/2020     Assessment/Plan 1. Type 2 diabetes mellitus without complication, with long-term current use of insulin (HCC) - A1c 15.5 09/20/2020 - insulin 70/30- 35 units twice daily - cont check blood sugars daily - cont diet low in carbs and sugars - Hemoglobin A1c - Lipid Panel - diabetic eye exam- future -  foot exam- future  2. Atrial fibrillation, unspecified type (HCC) - rate controlled with metoprolol - cont Eliquis for clot prevention - CBC with Differential/Platelet; Future - Basic Metabolic Panel  3. Overweight - BMI 39.74 - recommend reducing calories from 2500/day to 2000-2200/day - recommend exercising 150 min/week - Hepatic Function Panel  4. BMI 39.0-39.9,adult - same as above  5. History of kidney stones - no symptoms at this time - cont to avoid food tiggers  6. Gastroesophageal reflux disease without esophagitis - no recent reflux since dietary changes  7. Insomnia, unspecified type - sleeping about 6 hours  - recommend  avoiding electronics at night  8. Multiple subsegmental pulmonary emboli without acute cor pulmonale (HCC) - CT chest 09/2020- lobar and segmental PE of right lung - cont Eliquis 5 mg bid - cont follow up with Dr. Bertis Ruddy in August 2022  9. Psoriasis - lower legs with flaking skin, some skin discoloration - cont triamcinolone cream prn for flares - recommend covering areas with vaseline after bathing  10. Colonoscopy refused - he would like to get diabetes controlled before considering test - he is worried about fasting and his blood sugars - sister had colon cancer - no changes in bowel habits, stool - will discuss again next visit   Total time: 46 minutes. Greater than 50% of total time spent doing patient education and coordination of care regarding diabetes management and weight loss.     Next appt: 04/13/2021 Hazle Nordmann, Juel Burrow  [facility] [phone]

## 2021-01-05 ENCOUNTER — Other Ambulatory Visit: Payer: Self-pay

## 2021-01-05 ENCOUNTER — Encounter: Payer: Self-pay | Admitting: Orthopedic Surgery

## 2021-01-05 ENCOUNTER — Ambulatory Visit (INDEPENDENT_AMBULATORY_CARE_PROVIDER_SITE_OTHER): Payer: Self-pay | Admitting: Orthopedic Surgery

## 2021-01-05 VITALS — BP 122/80 | HR 78 | Temp 96.6°F | Ht 75.08 in | Wt 318.6 lb

## 2021-01-05 DIAGNOSIS — Z6839 Body mass index (BMI) 39.0-39.9, adult: Secondary | ICD-10-CM

## 2021-01-05 DIAGNOSIS — G4700 Insomnia, unspecified: Secondary | ICD-10-CM | POA: Insufficient documentation

## 2021-01-05 DIAGNOSIS — I4891 Unspecified atrial fibrillation: Secondary | ICD-10-CM

## 2021-01-05 DIAGNOSIS — Z5320 Procedure and treatment not carried out because of patient's decision for unspecified reasons: Secondary | ICD-10-CM

## 2021-01-05 DIAGNOSIS — E119 Type 2 diabetes mellitus without complications: Secondary | ICD-10-CM

## 2021-01-05 DIAGNOSIS — E663 Overweight: Secondary | ICD-10-CM | POA: Insufficient documentation

## 2021-01-05 DIAGNOSIS — K219 Gastro-esophageal reflux disease without esophagitis: Secondary | ICD-10-CM | POA: Insufficient documentation

## 2021-01-05 DIAGNOSIS — L409 Psoriasis, unspecified: Secondary | ICD-10-CM | POA: Insufficient documentation

## 2021-01-05 DIAGNOSIS — I2694 Multiple subsegmental pulmonary emboli without acute cor pulmonale: Secondary | ICD-10-CM

## 2021-01-05 DIAGNOSIS — Z87442 Personal history of urinary calculi: Secondary | ICD-10-CM

## 2021-01-05 DIAGNOSIS — Z794 Long term (current) use of insulin: Secondary | ICD-10-CM

## 2021-01-05 NOTE — Patient Instructions (Addendum)
Please have diabetic eye exam  Please consider having colonoscopy  Diabetes Mellitus and Nutrition, Adult When you have diabetes, or diabetes mellitus, it is very important to have healthy eating habits because your blood sugar (glucose) levels are greatly affected by what you eat and drink. Eating healthy foods in the right amounts, at about the same times every day, can help you:  Control your blood glucose.  Lower your risk of heart disease.  Improve your blood pressure.  Reach or maintain a healthy weight. What can affect my meal plan? Every person with diabetes is different, and each person has different needs for a meal plan. Your health care provider may recommend that you work with a dietitian to make a meal plan that is best for you. Your meal plan may vary depending on factors such as:  The calories you need.  The medicines you take.  Your weight.  Your blood glucose, blood pressure, and cholesterol levels.  Your activity level.  Other health conditions you have, such as heart or kidney disease. How do carbohydrates affect me? Carbohydrates, also called carbs, affect your blood glucose level more than any other type of food. Eating carbs naturally raises the amount of glucose in your blood. Carb counting is a method for keeping track of how many carbs you eat. Counting carbs is important to keep your blood glucose at a healthy level, especially if you use insulin or take certain oral diabetes medicines. It is important to know how many carbs you can safely have in each meal. This is different for every person. Your dietitian can help you calculate how many carbs you should have at each meal and for each snack. How does alcohol affect me? Alcohol can cause a sudden decrease in blood glucose (hypoglycemia), especially if you use insulin or take certain oral diabetes medicines. Hypoglycemia can be a life-threatening condition. Symptoms of hypoglycemia, such as sleepiness,  dizziness, and confusion, are similar to symptoms of having too much alcohol.  Do not drink alcohol if: ? Your health care provider tells you not to drink. ? You are pregnant, may be pregnant, or are planning to become pregnant.  If you drink alcohol: ? Do not drink on an empty stomach. ? Limit how much you use to:  0-1 drink a day for women.  0-2 drinks a day for men. ? Be aware of how much alcohol is in your drink. In the [place] one drink equals one 12 oz bottle of beer (355 mL), one 5 oz glass of wine (148 mL), or one 1 oz glass of hard liquor (44 mL). ? Keep yourself hydrated with water, diet soda, or unsweetened iced tea.  Keep in mind that regular soda, juice, and other mixers may contain a lot of sugar and must be counted as carbs. What are tips for following this plan? Reading food labels  Start by checking the serving size on the "Nutrition Facts" label of packaged foods and drinks. The amount of calories, carbs, fats, and other nutrients listed on the label is based on one serving of the item. Many items contain more than one serving per package.  Check the total grams (g) of carbs in one serving. You can calculate the number of servings of carbs in one serving by dividing the total carbs by 15. For example, if a food has 30 g of total carbs per serving, it would be equal to 2 servings of carbs.  Check the number of grams (g) of saturated  fats and trans fats in one serving. Choose foods that have a low amount or none of these fats.  Check the number of milligrams (mg) of salt (sodium) in one serving. Most people should limit total sodium intake to less than 2,300 mg per day.  Always check the nutrition information of foods labeled as "low-fat" or "nonfat." These foods may be higher in added sugar or refined carbs and should be avoided.  Talk to your dietitian to identify your daily goals for nutrients listed on the label. Shopping  Avoid buying canned, pre-made, or  processed foods. These foods tend to be high in fat, sodium, and added sugar.  Shop around the outside edge of the grocery store. This is where you will most often find fresh fruits and vegetables, bulk grains, fresh meats, and fresh dairy. Cooking  Use low-heat cooking methods, such as baking, instead of high-heat cooking methods like deep frying.  Cook using healthy oils, such as olive, canola, or sunflower oil.  Avoid cooking with butter, cream, or high-fat meats. Meal planning  Eat meals and snacks regularly, preferably at the same times every day. Avoid going long periods of time without eating.  Eat foods that are high in fiber, such as fresh fruits, vegetables, beans, and whole grains. Talk with your dietitian about how many servings of carbs you can eat at each meal.  Eat 4-6 oz (112-168 g) of lean protein each day, such as lean meat, chicken, fish, eggs, or tofu. One ounce (oz) of lean protein is equal to: ? 1 oz (28 g) of meat, chicken, or fish. ? 1 egg. ?  cup (62 g) of tofu.  Eat some foods each day that contain healthy fats, such as avocado, nuts, seeds, and fish.   What foods should I eat? Fruits Berries. Apples. Oranges. Peaches. Apricots. Plums. Grapes. Mango. Papaya. Pomegranate. Kiwi. Cherries. Vegetables Lettuce. Spinach. Leafy greens, including kale, chard, collard greens, and mustard greens. Beets. Cauliflower. Cabbage. Broccoli. Carrots. Green beans. Tomatoes. Peppers. Onions. Cucumbers. Brussels sprouts. Grains Whole grains, such as whole-wheat or whole-grain bread, crackers, tortillas, cereal, and pasta. Unsweetened oatmeal. Quinoa. Brown or wild rice. Meats and other proteins Seafood. Poultry without skin. Lean cuts of poultry and beef. Tofu. Nuts. Seeds. Dairy Low-fat or fat-free dairy products such as milk, yogurt, and cheese. The items listed above may not be a complete list of foods and beverages you can eat. Contact a dietitian for more  information. What foods should I avoid? Fruits Fruits canned with syrup. Vegetables Canned vegetables. Frozen vegetables with butter or cream sauce. Grains Refined white flour and flour products such as bread, pasta, snack foods, and cereals. Avoid all processed foods. Meats and other proteins Fatty cuts of meat. Poultry with skin. Breaded or fried meats. Processed meat. Avoid saturated fats. Dairy Full-fat yogurt, cheese, or milk. Beverages Sweetened drinks, such as soda or iced tea. The items listed above may not be a complete list of foods and beverages you should avoid. Contact a dietitian for more information. Questions to ask a health care provider  Do I need to meet with a diabetes educator?  Do I need to meet with a dietitian?  What number can I call if I have questions?  When are the best times to check my blood glucose? Where to find more information:  American Diabetes Association: diabetes.org  Academy of Nutrition and Dietetics: www.eatright.[employer] of Diabetes and Digestive and Kidney Diseases: [employer]  Association of Diabetes Care and Education  Specialists: www.diabeteseducator.org Summary  It is important to have healthy eating habits because your blood sugar (glucose) levels are greatly affected by what you eat and drink.  A healthy meal plan will help you control your blood glucose and maintain a healthy lifestyle.  Your health care provider may recommend that you work with a dietitian to make a meal plan that is best for you.  Keep in mind that carbohydrates (carbs) and alcohol have immediate effects on your blood glucose levels. It is important to count carbs and to use alcohol carefully. This information is not intended to replace advice given to you by your health care provider. Make sure you discuss any questions you have with your health care provider. Document Revised: 07/14/2019 Document Reviewed: 07/14/2019 Elsevier  Patient Education  2021 [employer].

## 2021-01-06 ENCOUNTER — Other Ambulatory Visit: Payer: Self-pay | Admitting: Orthopedic Surgery

## 2021-01-06 DIAGNOSIS — E1169 Type 2 diabetes mellitus with other specified complication: Secondary | ICD-10-CM

## 2021-01-06 DIAGNOSIS — I2694 Multiple subsegmental pulmonary emboli without acute cor pulmonale: Secondary | ICD-10-CM

## 2021-01-06 DIAGNOSIS — E119 Type 2 diabetes mellitus without complications: Secondary | ICD-10-CM

## 2021-01-06 DIAGNOSIS — I4891 Unspecified atrial fibrillation: Secondary | ICD-10-CM

## 2021-01-06 LAB — HEPATIC FUNCTION PANEL
AG Ratio: 1.3 (calc) (ref 1.0–2.5)
ALT: 15 U/L (ref 9–46)
AST: 17 U/L (ref 10–35)
Albumin: 4.3 g/dL (ref 3.6–5.1)
Alkaline phosphatase (APISO): 42 U/L (ref 35–144)
Bilirubin, Direct: 0.2 mg/dL (ref 0.0–0.2)
Globulin: 3.3 g/dL (calc) (ref 1.9–3.7)
Indirect Bilirubin: 0.8 mg/dL (calc) (ref 0.2–1.2)
Total Bilirubin: 1 mg/dL (ref 0.2–1.2)
Total Protein: 7.6 g/dL (ref 6.1–8.1)

## 2021-01-06 LAB — BASIC METABOLIC PANEL
BUN: 18 mg/dL (ref 7–25)
CO2: 26 mmol/L (ref 20–32)
Calcium: 9.5 mg/dL (ref 8.6–10.3)
Chloride: 104 mmol/L (ref 98–110)
Creat: 0.84 mg/dL (ref 0.70–1.33)
Glucose, Bld: 101 mg/dL — ABNORMAL HIGH (ref 65–99)
Potassium: 3.6 mmol/L (ref 3.5–5.3)
Sodium: 140 mmol/L (ref 135–146)

## 2021-01-06 LAB — LIPID PANEL
Cholesterol: 144 mg/dL (ref <200)
HDL: 41 mg/dL (ref >=40)
LDL Cholesterol (Calc): 89 mg/dL (calc)
Non-HDL Cholesterol (Calc): 103 mg/dL (calc) (ref <130)
Total CHOL/HDL Ratio: 3.5 (calc) (ref <5.0)
Triglycerides: 48 mg/dL (ref <150)

## 2021-01-06 LAB — HEMOGLOBIN A1C
Hgb A1c MFr Bld: 5.5 % of total Hgb (ref <5.7)
Mean Plasma Glucose: 111 mg/dL
eAG (mmol/L): 6.2 mmol/L

## 2021-01-06 MED ORDER — METFORMIN HCL ER 500 MG PO TB24: 500.0000 mg | ORAL_TABLET | Freq: Every day | ORAL | 3 refills | Status: DC

## 2021-01-06 MED ORDER — NOVOLOG MIX 70/30 FLEXPEN (70-30) 100 UNIT/ML ~~LOC~~ SUPN: 15.0000 [IU] | PEN_INJECTOR | Freq: Two times a day (BID) | SUBCUTANEOUS | 11 refills | Status: DC

## 2021-01-06 MED ORDER — ATORVASTATIN CALCIUM 20 MG PO TABS: 20.0000 mg | ORAL_TABLET | Freq: Every day | ORAL | 3 refills | Status: DC

## 2021-01-06 MED ORDER — METOPROLOL TARTRATE 100 MG PO TABS: 100.0000 mg | ORAL_TABLET | Freq: Two times a day (BID) | ORAL | 2 refills | Status: DC

## 2021-01-06 MED ORDER — ELIQUIS 5 MG PO TABS: 5.0000 mg | ORAL_TABLET | Freq: Two times a day (BID) | ORAL | 3 refills | Status: DC

## 2021-01-11 ENCOUNTER — Encounter: Payer: Self-pay | Admitting: Orthopedic Surgery

## 2021-01-12 NOTE — Telephone Encounter (Signed)
Stool issues normal when starting metformin, try to give it a month.   If leg cramps persist, try 1/2 pill of statin.   Although they sound the same they are different. Both offer intermediate glucose control, but Novolog also has very fast acting insulin- Novolin does not. We can change to this medication because the are very smiliar. I do not know the effects it will have on your sugars.   No recommendations on eye specialist, just ask about diabetic eye exam for Retina is checked. There is a [facility] in town- google them.

## 2021-01-23 ENCOUNTER — Encounter: Payer: Self-pay | Admitting: Orthopedic Surgery

## 2021-03-16 ENCOUNTER — Encounter: Payer: Self-pay | Admitting: Hematology and Oncology

## 2021-03-16 ENCOUNTER — Other Ambulatory Visit: Payer: Self-pay | Admitting: Hematology and Oncology

## 2021-03-16 ENCOUNTER — Telehealth: Payer: Self-pay

## 2021-03-16 DIAGNOSIS — I4891 Unspecified atrial fibrillation: Secondary | ICD-10-CM

## 2021-03-16 NOTE — Telephone Encounter (Signed)
Called and scheduled appts for 7/29. He is aware of appt date/time.

## 2021-03-16 NOTE — Telephone Encounter (Signed)
-----   Message from Artis Delay, MD sent at 03/16/2021 10:46 AM EDT ----- He sent multiple mychart messages I recommend him to come in tomorrow Please schedule labs, EKG and see me, 20 mins

## 2021-03-17 ENCOUNTER — Inpatient Hospital Stay (HOSPITAL_BASED_OUTPATIENT_CLINIC_OR_DEPARTMENT_OTHER): Payer: Self-pay | Admitting: Hematology and Oncology

## 2021-03-17 ENCOUNTER — Inpatient Hospital Stay: Payer: Self-pay | Attending: Hematology and Oncology

## 2021-03-17 ENCOUNTER — Encounter: Payer: Self-pay | Admitting: Hematology and Oncology

## 2021-03-17 ENCOUNTER — Other Ambulatory Visit: Payer: Self-pay

## 2021-03-17 DIAGNOSIS — N189 Chronic kidney disease, unspecified: Secondary | ICD-10-CM | POA: Insufficient documentation

## 2021-03-17 DIAGNOSIS — Z794 Long term (current) use of insulin: Secondary | ICD-10-CM | POA: Insufficient documentation

## 2021-03-17 DIAGNOSIS — E119 Type 2 diabetes mellitus without complications: Secondary | ICD-10-CM

## 2021-03-17 DIAGNOSIS — N179 Acute kidney failure, unspecified: Secondary | ICD-10-CM

## 2021-03-17 DIAGNOSIS — R350 Frequency of micturition: Secondary | ICD-10-CM | POA: Insufficient documentation

## 2021-03-17 DIAGNOSIS — Z7901 Long term (current) use of anticoagulants: Secondary | ICD-10-CM | POA: Insufficient documentation

## 2021-03-17 DIAGNOSIS — I2699 Other pulmonary embolism without acute cor pulmonale: Secondary | ICD-10-CM

## 2021-03-17 DIAGNOSIS — D696 Thrombocytopenia, unspecified: Secondary | ICD-10-CM

## 2021-03-17 DIAGNOSIS — K760 Fatty (change of) liver, not elsewhere classified: Secondary | ICD-10-CM | POA: Insufficient documentation

## 2021-03-17 DIAGNOSIS — R635 Abnormal weight gain: Secondary | ICD-10-CM | POA: Insufficient documentation

## 2021-03-17 LAB — CBC WITH DIFFERENTIAL/PLATELET
Abs Immature Granulocytes: 0.04 10*3/uL (ref 0.00–0.07)
Basophils Absolute: 0 10*3/uL (ref 0.0–0.1)
Basophils Relative: 0 %
Eosinophils Absolute: 0.2 10*3/uL (ref 0.0–0.5)
Eosinophils Relative: 2 %
HCT: 49.4 % (ref 39.0–52.0)
Hemoglobin: 16.8 g/dL (ref 13.0–17.0)
Immature Granulocytes: 0 %
Lymphocytes Relative: 30 %
Lymphs Abs: 3.4 10*3/uL (ref 0.7–4.0)
MCH: 30.5 pg (ref 26.0–34.0)
MCHC: 34 g/dL (ref 30.0–36.0)
MCV: 89.8 fL (ref 80.0–100.0)
Monocytes Absolute: 1.4 10*3/uL — ABNORMAL HIGH (ref 0.1–1.0)
Monocytes Relative: 12 %
Neutro Abs: 6.3 10*3/uL (ref 1.7–7.7)
Neutrophils Relative %: 56 %
Platelets: 197 10*3/uL (ref 150–400)
RBC: 5.5 MIL/uL (ref 4.22–5.81)
RDW: 13 % (ref 11.5–15.5)
WBC: 11.4 10*3/uL — ABNORMAL HIGH (ref 4.0–10.5)
nRBC: 0 % (ref 0.0–0.2)

## 2021-03-17 LAB — BASIC METABOLIC PANEL - CANCER CENTER ONLY
Anion gap: 9 (ref 5–15)
BUN: 25 mg/dL — ABNORMAL HIGH (ref 6–20)
CO2: 28 mmol/L (ref 22–32)
Calcium: 10.1 mg/dL (ref 8.9–10.3)
Chloride: 106 mmol/L (ref 98–111)
Creatinine: 1.11 mg/dL (ref 0.61–1.24)
GFR, Estimated: >60 mL/min (ref >60)
Glucose, Bld: 106 mg/dL — ABNORMAL HIGH (ref 70–99)
Potassium: 3.9 mmol/L (ref 3.5–5.1)
Sodium: 143 mmol/L (ref 135–145)

## 2021-03-17 NOTE — Assessment & Plan Note (Addendum)
He is completely asymptomatic since he is compliant taking anticoagulation therapy as directed By next week, he would have finished 6 months of anticoagulation therapy We discussed the risk and benefits of extended duration anticoagulation therapy for secondary prevention We also discussed the rationale behind switching him to aspirin after completion of anticoagulation therapy, due to cardiovascular risk factors His recent EKG today showed he is in normal sinus rhythm His blood pressure remains elevated and the patient is currently taking insulin for diabetes We discussed extensively some of the risk factors for recurrent blood clot such as sedentary lifestyle, dehydration, tobacco abuse and others We also discussed the utility of D-dimer test in the future and he appears interested in the test I plan to set up blood work in September, approximately a month after he finish Eliquis and switch over to aspirin for further evaluation If his D-dimer is high, I would recommend switching him back to Eliquis for secondary prevention There is no utility to repeat CT imaging of the chest moving forward

## 2021-03-17 NOTE — Assessment & Plan Note (Signed)
We have extensive discussions about the importance of dietary modification and weight loss I am hopeful he can get out of insulin in the near future He has gained a lot of weight because of that

## 2021-03-17 NOTE — Progress Notes (Signed)
[facility] OFFICE PROGRESS NOTE  Richard Mullins, Richard E, NP  ASSESSMENT & PLAN:  Acute pulmonary embolism without acute cor pulmonale (HCC) He is completely asymptomatic since he is compliant taking anticoagulation therapy as directed By next week, he would have finished 6 months of anticoagulation therapy We discussed the risk and benefits of extended duration anticoagulation therapy for secondary prevention We also discussed the rationale behind switching him to aspirin after completion of anticoagulation therapy, due to cardiovascular risk factors His recent EKG today showed he is in normal sinus rhythm His blood pressure remains elevated and the patient is currently taking insulin for diabetes We discussed extensively some of the risk factors for recurrent blood clot such as sedentary lifestyle, dehydration, tobacco abuse and others We also discussed the utility of D-dimer test in the future and he appears interested in the test I plan to set up blood work in September, approximately a month after he finish Eliquis and switch over to aspirin for further evaluation If his D-dimer is high, I would recommend switching him back to Eliquis for secondary prevention There is no utility to repeat CT imaging of the chest moving forward  Type 2 diabetes mellitus without complication, with long-term current use of insulin ([place]) We have extensive discussions about the importance of dietary modification and weight loss I am hopeful he can get out of insulin in the near future He has gained a lot of weight because of that  Orders Placed This Encounter  Procedures   EKG 12-Lead    Ordered by Richard Mullins     The total time spent in the appointment was 30 minutes encounter with patients including review of chart and various tests results, discussions about plan of care and coordination of care plan   All questions were answered. The patient knows to call the clinic with any problems, questions  or concerns. No barriers to learning was detected.    Richard Lark, MD 7/29/202212:54 PM  INTERVAL HISTORY: Richard Mullins 55 y.o. male returns for further follow-up regarding prior thrombocytopenia, severe PE, newly diagnosed insulin-dependent diabetes and chronic kidney disease Is doing well from anticoagulation standpoint Denies recent bleeding He is concerned about the cost of Eliquis as well as taking it twice daily His diabetes is well managed but unfortunately, he has gained a lot of weight He is active in activities of daily living and remembers drinking plenty of fluids He denies chest pain or shortness of breath  SUMMARY OF HEMATOLOGIC HISTORY: Richard Mullins 55 y.o. male returns for hospital follow-up Since last time I saw him, he is doing well He has several episodes of hallucination, brief episodes, With changes in his vision intermittently He is documented blood sugar has been low, occasionally less than 100 He has avoided sweetened beverages and drink approximately 3 L of water per day He denies recent chest pain or shortness of breath The patient denies any recent signs or symptoms of bleeding such as spontaneous epistaxis, hematuria or hematochezia.  SUMMARY OF HEMATOLOGIC HISTORY: Please see my original consult note dated 09/25/2020 for further details Richard Mullins was seen here because of thrombocytopenia and recent diagnosis of PE   The patient is a reasonable historian He lives alone by himself, single with no children He worked part-time with a [employer] He does not have a primary care doctor and has not seen physician for some time He is otherwise healthy   He has not been feeling well since August of last year with feeling  unwell, blurriness of vision and nonspecific weight loss He also have frequent urination In 2019, he had diagnosis of kidney stone He admits to poor dietary choices and drink plenty of soda, sweetened beverages and juices He  complains of fatigue He denies chest pain or shortness of breath but felt so poorly to the point that he presented to the emergency department several days ago He was found to have uncontrolled diabetes, atrial fibrillation as well as pulmonary embolism We do not have baseline blood work but on admission on 09/20/2020, he has elevated white blood cell count, hemoglobin of 18.6 and a platelet count of 296,000 CT imaging show PE as well as hepatic steatosis He was placed on aggressive insulin regimen for uncontrolled hyperglycemia His first measure blood sugar was over 1000 and he presented with signs of acute kidney injury, hypercalcemia and liver failure   After aggressive management and hydration, his hemoglobin is now 13.2 but he has platelet count trending down to slightly under 100,000.  His liver failure is almost back to normal His serum creatinine has normalized and his calcium are now normal He underwent echocardiogram which showed preserved ejection fraction On monitor today, he appears to be in sinus rhythm He was originally placed on IV heparin but then transitioned to Eliquis   Due to low platelet count, I was consulted for evaluation He has no signs of bleeding since admission   In the background, he has occasional intermittent right-sided nosebleed and occasional bright red blood per rectum He has not had colonoscopy due to lack of insurance   He denies prior blood or platelet transfusions He had minor surgery with wisdom tooth extraction in the past He has no prior history of blood clots He has no family history of blood clots He denies testosterone use He is not a smoker The patient stated he does not own a car and typically either walk or bicycle to grocery stores for supplies He is noted to have chronic bilateral lower extremity edema which he attributed to other causes He denies trauma to his lower extremities The patient was subsequently discharged home on Eliquis  I  have reviewed the past medical history, past surgical history, social history and family history with the patient and they are unchanged from previous note.  ALLERGIES:  is allergic to monosodium glutamate.  MEDICATIONS:  Current Outpatient Medications  Medication Sig Dispense Refill   acetaminophen (TYLENOL) 500 MG tablet Take 500 mg by mouth as needed.     Aloe Vera 99 % GEL Apply topically as needed.     apixaban (ELIQUIS) 5 MG TABS tablet Take 1 tablet (5 mg total) by mouth 2 (two) times daily. 60 tablet 3   atorvastatin (LIPITOR) 20 MG tablet Take 1 tablet (20 mg total) by mouth daily. 30 tablet 3   Blood Glucose Monitoring Suppl (RELION CONFIRM GLUCOSE MONITOR) w/Device KIT by Does not apply route as directed.     clotrimazole (LOTRIMIN) 1 % cream Apply 1 application topically as needed (for foot fungus).     diphenhydrAMINE (BENADRYL) 25 MG tablet Take 25 mg by mouth as needed.     insulin aspart protamine - aspart (NOVOLOG MIX 70/30 FLEXPEN) (70-30) 100 UNIT/ML FlexPen Inject 0.15 mLs (15 Units total) into the skin 2 (two) times daily. 15 mL 11   Insulin Pen Needle (PEN NEEDLES) 32G X 4 MM MISC 1 Device by Does not apply route 2 (two) times daily. With administration of Novolog     Isopropyl  Alcohol (ALCOHOL WIPES EX) Apply topically in the morning and at bedtime.     loperamide (IMODIUM) 2 MG capsule Take by mouth as needed for diarrhea or loose stools.     metFORMIN (GLUCOPHAGE XR) 500 MG 24 hr tablet Take 1 tablet (500 mg total) by mouth daily with breakfast. 30 tablet 3   metoprolol tartrate (LOPRESSOR) 100 MG tablet Take 1 tablet (100 mg total) by mouth 2 (two) times daily. 60 tablet 2   Multiple Vitamin (MULTIVITAMIN ADULT PO) Take 1 tablet by mouth once a week.     triamcinolone (KENALOG) 0.1 % Apply topically 3 (three) times daily. 30 g 0   No current facility-administered medications for this visit.     REVIEW OF SYSTEMS:   Constitutional: Denies fevers, chills or night  sweats Eyes: Denies blurriness of vision Ears, nose, mouth, throat, and face: Denies mucositis or sore throat Respiratory: Denies cough, dyspnea or wheezes Cardiovascular: Denies palpitation, chest discomfort or lower extremity swelling Gastrointestinal:  Denies nausea, heartburn or change in bowel habits Skin: Denies abnormal skin rashes Lymphatics: Denies new lymphadenopathy or easy bruising Neurological:Denies numbness, tingling or new weaknesses Behavioral/Psych: Mood is stable, no new changes  All other systems were reviewed with the patient and are negative.  PHYSICAL EXAMINATION: ECOG PERFORMANCE STATUS: 0 - Asymptomatic  Vitals:   03/17/21 1203  BP: (!) 151/74  Pulse: 81  Resp: 18  Temp: 98.7 F (37.1 C)  SpO2: 97%   Filed Weights   03/17/21 1203  Weight: (!) 330 lb (149.7 kg)    GENERAL:alert, no distress and comfortable SKIN: Noted diffuse thickening of his skin bilateral legs with chronic venous stasis changes NEURO: alert & oriented x 3 with fluent speech, no focal motor/sensory deficits  LABORATORY DATA:  I have reviewed the data as listed     Component Value Date/Time   NA 143 03/17/2021 1133   K 3.9 03/17/2021 1133   CL 106 03/17/2021 1133   CO2 28 03/17/2021 1133   GLUCOSE 106 (H) 03/17/2021 1133   BUN 25 (H) 03/17/2021 1133   CREATININE 1.11 03/17/2021 1133   CREATININE 0.84 01/05/2021 0935   CALCIUM 10.1 03/17/2021 1133   PROT 7.6 01/05/2021 0935   ALBUMIN 2.5 (L) 09/25/2020 0145   AST 17 01/05/2021 0935   ALT 15 01/05/2021 0935   ALKPHOS 42 09/25/2020 0145   BILITOT 1.0 01/05/2021 0935   GFRNONAA >60 03/17/2021 1133    No results found for: SPEP, UPEP  Lab Results  Component Value Date   WBC 11.4 (H) 03/17/2021   NEUTROABS 6.3 03/17/2021   HGB 16.8 03/17/2021   HCT 49.4 03/17/2021   MCV 89.8 03/17/2021   PLT 197 03/17/2021      Chemistry      Component Value Date/Time   NA 143 03/17/2021 1133   K 3.9 03/17/2021 1133   CL 106  03/17/2021 1133   CO2 28 03/17/2021 1133   BUN 25 (H) 03/17/2021 1133   CREATININE 1.11 03/17/2021 1133   CREATININE 0.84 01/05/2021 0935      Component Value Date/Time   CALCIUM 10.1 03/17/2021 1133   ALKPHOS 42 09/25/2020 0145   AST 17 01/05/2021 0935   ALT 15 01/05/2021 0935   BILITOT 1.0 01/05/2021 0935

## 2021-03-20 ENCOUNTER — Encounter: Payer: Self-pay | Admitting: Hematology and Oncology

## 2021-04-04 ENCOUNTER — Ambulatory Visit: Payer: Self-pay | Admitting: Hematology and Oncology

## 2021-04-04 ENCOUNTER — Other Ambulatory Visit: Payer: Self-pay

## 2021-04-08 ENCOUNTER — Encounter: Payer: Self-pay | Admitting: Hematology and Oncology

## 2021-04-11 ENCOUNTER — Other Ambulatory Visit: Payer: Self-pay

## 2021-04-11 DIAGNOSIS — Z794 Long term (current) use of insulin: Secondary | ICD-10-CM

## 2021-04-11 DIAGNOSIS — E119 Type 2 diabetes mellitus without complications: Secondary | ICD-10-CM

## 2021-04-11 DIAGNOSIS — I4891 Unspecified atrial fibrillation: Secondary | ICD-10-CM

## 2021-04-12 LAB — BASIC METABOLIC PANEL
BUN: 24 mg/dL (ref 7–25)
CO2: 24 mmol/L (ref 20–32)
Calcium: 9.2 mg/dL (ref 8.6–10.3)
Chloride: 104 mmol/L (ref 98–110)
Creat: 0.87 mg/dL (ref 0.70–1.30)
Glucose, Bld: 117 mg/dL — ABNORMAL HIGH (ref 65–99)
Potassium: 4.1 mmol/L (ref 3.5–5.3)
Sodium: 140 mmol/L (ref 135–146)

## 2021-04-12 LAB — CBC WITH DIFFERENTIAL/PLATELET
Absolute Monocytes: 1112 cells/uL — ABNORMAL HIGH (ref 200–950)
Basophils Absolute: 41 cells/uL (ref 0–200)
Basophils Relative: 0.4 %
Eosinophils Absolute: 163 cells/uL (ref 15–500)
Eosinophils Relative: 1.6 %
HCT: 50.6 % — ABNORMAL HIGH (ref 38.5–50.0)
Hemoglobin: 17.2 g/dL — ABNORMAL HIGH (ref 13.2–17.1)
Lymphs Abs: 3295 cells/uL (ref 850–3900)
MCH: 30.8 pg (ref 27.0–33.0)
MCHC: 34 g/dL (ref 32.0–36.0)
MCV: 90.5 fL (ref 80.0–100.0)
MPV: 9.8 fL (ref 7.5–12.5)
Monocytes Relative: 10.9 %
Neutro Abs: 5590 cells/uL (ref 1500–7800)
Neutrophils Relative %: 54.8 %
Platelets: 216 10*3/uL (ref 140–400)
RBC: 5.59 10*6/uL (ref 4.20–5.80)
RDW: 13.1 % (ref 11.0–15.0)
Total Lymphocyte: 32.3 %
WBC: 10.2 10*3/uL (ref 3.8–10.8)

## 2021-04-12 LAB — HEMOGLOBIN A1C
Hgb A1c MFr Bld: 5.7 % of total Hgb — ABNORMAL HIGH (ref <5.7)
Mean Plasma Glucose: 117 mg/dL
eAG (mmol/L): 6.5 mmol/L

## 2021-04-13 ENCOUNTER — Other Ambulatory Visit: Payer: Self-pay

## 2021-04-13 ENCOUNTER — Encounter: Payer: Self-pay | Admitting: Orthopedic Surgery

## 2021-04-13 ENCOUNTER — Ambulatory Visit (INDEPENDENT_AMBULATORY_CARE_PROVIDER_SITE_OTHER): Payer: Self-pay | Admitting: Orthopedic Surgery

## 2021-04-13 VITALS — BP 136/82 | HR 69 | Temp 97.3°F | Ht 75.0 in | Wt 338.0 lb

## 2021-04-13 DIAGNOSIS — E663 Overweight: Secondary | ICD-10-CM

## 2021-04-13 DIAGNOSIS — Z23 Encounter for immunization: Secondary | ICD-10-CM

## 2021-04-13 DIAGNOSIS — Z5320 Procedure and treatment not carried out because of patient's decision for unspecified reasons: Secondary | ICD-10-CM

## 2021-04-13 DIAGNOSIS — L409 Psoriasis, unspecified: Secondary | ICD-10-CM

## 2021-04-13 DIAGNOSIS — Z794 Long term (current) use of insulin: Secondary | ICD-10-CM

## 2021-04-13 DIAGNOSIS — Z6839 Body mass index (BMI) 39.0-39.9, adult: Secondary | ICD-10-CM

## 2021-04-13 DIAGNOSIS — E1169 Type 2 diabetes mellitus with other specified complication: Secondary | ICD-10-CM

## 2021-04-13 DIAGNOSIS — E785 Hyperlipidemia, unspecified: Secondary | ICD-10-CM

## 2021-04-13 DIAGNOSIS — I4891 Unspecified atrial fibrillation: Secondary | ICD-10-CM

## 2021-04-13 DIAGNOSIS — I2694 Multiple subsegmental pulmonary emboli without acute cor pulmonale: Secondary | ICD-10-CM

## 2021-04-13 DIAGNOSIS — E119 Type 2 diabetes mellitus without complications: Secondary | ICD-10-CM

## 2021-04-13 MED ORDER — NOVOLOG MIX 70/30 FLEXPEN (70-30) 100 UNIT/ML ~~LOC~~ SUPN: 10.0000 [IU] | PEN_INJECTOR | Freq: Two times a day (BID) | SUBCUTANEOUS | 11 refills | Status: DC

## 2021-04-13 NOTE — Patient Instructions (Signed)
Ok to get covid booster- recommend [employer]

## 2021-04-13 NOTE — Progress Notes (Signed)
Careteam: Patient Care Team: Richard Alanis, NP as PCP - General (Adult Health Nurse Practitioner) Heath Lark, MD as Consulting Physician (Hematology and Oncology)  Seen by: Windell Moulding, AGNP-C  PLACE OF SERVICE:  [place]   HPI: Patient is a 55 y.o. male seen today for medical management of chronic conditions.   Lab results discussed with patient.   Diabetes- reports one hypoglycemic event a few weeks ago. He reports feeling a hallucination during event. He estimates his sugar was around 70, he drank a soda and felt better within a few minutes. Continues to take metformin, 70/30 insulin (15 units bid), aspirin, and statin. Reports following low carb diet.    07/29 he saw Dr. Alvy Bimler due to acute pulmonary embolism from earlier this year. At this time he is off Eliquis and scheduled to have d-dimer test in September. Recommend restarting Eliquis if d-dimer is high.   Dental pain to left upper side for one week about a month ago. Scheduled to see dentist in September. Symptoms have subsided at this time. He has been told he has wisdom teeth that should be removed when he is older.   Left foot pain. Rides his bike daily and twisted his ankle recently. He does not take any medication at this time. Reports pain is slowly subsiding.   Psoriasis to left lower leg worse than right. He has been applying aloe vera when area itches.   His weight has increased by 20 lbs since May 2022. Discussed weight loss options. He reports eating healthy, suspects it is his portion size.   Plans to schedule covid booster soon.   He is interested in having pneumococcal 23 and tetanus vaccine next visit.   He is not interested in hepatitis C screening at this time due to cost.   Colonoscopy- he has no desire to have one. Education provided on benefits of having test. He is willing to think about it and discuss more next visit. Denies changes to stool, blood in stool or family history of colon  cancer.      Allergies  Allergen Reactions   Monosodium Glutamate Diarrhea, Hives and Nausea And Vomiting    Chief Complaint  Patient presents with   Medical Management of Chronic Issues    Medical Management of Chronic Issues. 3 Month Follow up with Foot Exam, Urine Microalbumin,Colonoscopy       Review of Systems:  Review of Systems  Constitutional:  Negative for chills, fever, malaise/fatigue and weight loss.  HENT: Negative.    Eyes: Negative.        Glasses  Respiratory:  Negative for cough, shortness of breath and wheezing.   Cardiovascular:  Negative for chest pain and leg swelling.  Gastrointestinal:  Positive for heartburn. Negative for abdominal pain, blood in stool, constipation, diarrhea and vomiting.  Genitourinary:  Negative for dysuria, frequency and hematuria.  Musculoskeletal:  Negative for falls.       Left ankle pain  Skin:        psoriasis  Neurological:  Negative for dizziness, tingling, sensory change and weakness.  Psychiatric/Behavioral:  Negative for depression and memory loss. The patient is not nervous/anxious.    Past Medical History:  Diagnosis Date   Atrial fibrillation ([place]) 49/75/3005   Complication of anesthesia    DURING DENTAL PROCEDURE   Diabetes mellitus without complication ([place])    Gastroesophageal reflux disease    History of blood clots 09/20/2020   History of COVID-19 2022   PONV (postoperative  nausea and vomiting)    Psoriasis    Past Surgical History:  Procedure Laterality Date   ingrown toenails  1995   WISDOM TOOTH EXTRACTION     Social History:   reports that he has never smoked. He has never used smokeless tobacco. He reports current alcohol use. He reports that he does not currently use drugs.  Family History  Problem Relation Age of Onset   Lung cancer Father    Colon cancer Sister    Heart attack Maternal Uncle 65   High blood pressure Sister    ADD / ADHD Sister    Diabetes Sister      Medications: Patient's Medications  New Prescriptions   No medications on file  Previous Medications   ACETAMINOPHEN (TYLENOL) 500 MG TABLET    Take 500 mg by mouth as needed.   ALOE VERA 99 % GEL    Apply topically as needed.   ATORVASTATIN (LIPITOR) 20 MG TABLET    Take 1 tablet (20 mg total) by mouth daily.   BLOOD GLUCOSE MONITORING SUPPL (RELION CONFIRM GLUCOSE MONITOR) W/DEVICE KIT    by Does not apply route as directed.   CLOTRIMAZOLE (LOTRIMIN) 1 % CREAM    Apply 1 application topically as needed (for foot fungus).   INSULIN ASPART PROTAMINE - ASPART (NOVOLOG MIX 70/30 FLEXPEN) (70-30) 100 UNIT/ML FLEXPEN    Inject 0.15 mLs (15 Units total) into the skin 2 (two) times daily.   INSULIN PEN NEEDLE (PEN NEEDLES) 32G X 4 MM MISC    1 Device by Does not apply route 2 (two) times daily. With administration of Novolog   ISOPROPYL ALCOHOL (ALCOHOL WIPES EX)    Apply topically in the morning and at bedtime.   LOPERAMIDE (IMODIUM) 2 MG CAPSULE    Take by mouth as needed for diarrhea or loose stools.   METFORMIN (GLUCOPHAGE XR) 500 MG 24 HR TABLET    Take 1 tablet (500 mg total) by mouth daily with breakfast.   METOPROLOL TARTRATE (LOPRESSOR) 100 MG TABLET    Take 1 tablet (100 mg total) by mouth 2 (two) times daily.   MULTIPLE VITAMIN (MULTIVITAMIN ADULT PO)    Take 1 tablet by mouth once a week.   TRIAMCINOLONE (KENALOG) 0.1 %    Apply topically 3 (three) times daily.  Modified Medications   No medications on file  Discontinued Medications   APIXABAN (ELIQUIS) 5 MG TABS TABLET    Take 1 tablet (5 mg total) by mouth 2 (two) times daily.   DIPHENHYDRAMINE (BENADRYL) 25 MG TABLET    Take 25 mg by mouth as needed.    Physical Exam:  Vitals:   04/13/21 1301  BP: 136/82  Pulse: 69  Temp: (!) 97.3 F (36.3 C)  TempSrc: Skin  SpO2: 97%  Weight: (!) 338 lb (153.3 kg)  Height: 6' 3"  (1.905 m)   Body mass index is 42.25 kg/m. Wt Readings from Last 3 Encounters:  04/13/21 (!) 338 lb  (153.3 kg)  03/17/21 (!) 330 lb (149.7 kg)  01/05/21 (!) 318 lb 9.6 oz (144.5 kg)    Physical Exam Vitals reviewed.  Constitutional:      General: He is not in acute distress.    Appearance: He is obese.  HENT:     Head: Normocephalic.  Eyes:     General:        Right eye: No discharge.        Left eye: No discharge.  Cardiovascular:  Rate and Rhythm: Normal rate and regular rhythm.     Pulses: Normal pulses.     Heart sounds: Normal heart sounds.  Pulmonary:     Effort: Pulmonary effort is normal. No respiratory distress.     Breath sounds: Normal breath sounds. No wheezing.  Abdominal:     General: Bowel sounds are normal. There is no distension.     Palpations: Abdomen is soft.     Tenderness: There is no abdominal tenderness.  Musculoskeletal:     Cervical back: Normal range of motion.     Right lower leg: Edema present.     Left lower leg: Edema present.     Comments: Non-pitting  Feet:     Right foot:     Protective Sensation: 10 sites tested.  10 sites sensed.     Toenail Condition: Right toenails are abnormally thick. Fungal disease present.    Left foot:     Protective Sensation: 10 sites tested.  10 sites sensed.     Toenail Condition: Left toenails are abnormally thick. Fungal disease present. Lymphadenopathy:     Cervical: No cervical adenopathy.  Skin:    General: Skin is warm and dry.     Capillary Refill: Capillary refill takes less than 2 seconds.     Comments: Tan discoloration to BLE, dry flaking skin present.   Neurological:     General: No focal deficit present.     Mental Status: He is alert and oriented to person, place, and time.  Psychiatric:        Mood and Affect: Mood normal.        Behavior: Behavior normal.    Labs reviewed: Basic Metabolic Panel: Recent Labs    09/22/20 0242 09/22/20 0243 09/23/20 1108 09/24/20 0457 09/25/20 0145 01/05/21 0935 03/17/21 1133 04/11/21 0826  NA  --   --  142 141   < > 140 143 140  K 3.3*   --  3.6 3.6   < > 3.6 3.9 4.1  CL  --   --  110 108   < > 104 106 104  CO2  --   --  21* 24   < > 26 28 24   GLUCOSE  --   --  312* 186*   < > 101* 106* 117*  BUN  --   --  19 14   < > 18 25* 24  CREATININE  --   --  0.92 0.90   < > 0.84 1.11 0.87  CALCIUM  --   --  8.8* 8.3*   < > 9.5 10.1 9.2  MG 2.4  --  2.1 2.0  --   --   --   --   TSH  --  0.250*  --   --   --   --   --   --    < > = values in this interval not displayed.   Liver Function Tests: Recent Labs    09/20/20 1010 09/25/20 0145 01/05/21 0935  AST 32 55* 17  ALT 54* 59* 15  ALKPHOS 65 42  --   BILITOT 2.2* 1.0 1.0  PROT 8.5* 5.6* 7.6  ALBUMIN 4.0 2.5*  --    Recent Labs    09/20/20 1010  LIPASE 48   No results for input(s): AMMONIA in the last 8760 hours. CBC: Recent Labs    10/04/20 1305 03/17/21 1133 04/11/21 0826  WBC 6.3 11.4* 10.2  NEUTROABS 3.4 6.3 5,590  HGB  14.5 16.8 17.2*  HCT 42.6 49.4 50.6*  MCV 91.8 89.8 90.5  PLT 269 197 216   Lipid Panel: Recent Labs    01/05/21 0935  CHOL 144  HDL 41  LDLCALC 89  TRIG 48  CHOLHDL 3.5   TSH: Recent Labs    09/22/20 0243  TSH 0.250*   A1C: Lab Results  Component Value Date   HGBA1C 5.7 (H) 04/11/2021     Assessment/Plan 1. Type 2 diabetes mellitus without complication, with long-term current use of insulin (H[facility]) - hgb A1c 5.7 04/11/2021 - cont metformin 500 mg po daily - plan to slowly reduce insulin and increase metformin - advised patient to mychart CBG results next week after starting reduced insulin - insulin aspart protamine - aspart (NOVOLOG MIX 70/30 FLEXPEN) (70-30) 100 UNIT/ML FlexPen; Inject 10 Units into the skin 2 (two) times daily.  Dispense: 15 mL; Refill: 11 - continue low carb diet - urine microalbumin- future - A1c- future  2. Hyperlipidemia associated with type 2 diabetes mellitus ([place]) - LDL 89 01/05/2021, goal < 70 due to diabetes - cont statin - lipid panel future  3. Atrial fibrillation, unspecified type  ([place]) - rate controlled with metoprolol - remains on aspirin  4. Multiple subsegmental pulmonary emboli without acute cor pulmonale ([place]) - followed by hematology - Eliquis discontinued last month - d-dimer scheduled for September  5. Overweight - gained 20 lbs since May 2022 - recommend portion sizing - recommend counting calories < 2000 calories/day  6. BMI 39.0-39.9,adult - see above  7. Psoriasis - stable with aloe vera  8. Colonoscopy refused - will discuss next visit  9. Need for tetanus booster - will administer next visit  10. Need for pneumococcal vaccine - will administer next visit  Total time: 38 minutes. Greater than 50% of total time spent doing patient education on health maintenance, medication management and diet.     Next appt: 07/20/2021  Windell Moulding, [facility] [phone]

## 2021-04-20 ENCOUNTER — Other Ambulatory Visit: Payer: Self-pay | Admitting: Orthopedic Surgery

## 2021-04-20 ENCOUNTER — Encounter: Payer: Self-pay | Admitting: Orthopedic Surgery

## 2021-04-20 DIAGNOSIS — E1169 Type 2 diabetes mellitus with other specified complication: Secondary | ICD-10-CM

## 2021-04-20 DIAGNOSIS — E785 Hyperlipidemia, unspecified: Secondary | ICD-10-CM

## 2021-04-27 ENCOUNTER — Encounter: Payer: Self-pay | Admitting: Orthopedic Surgery

## 2021-05-01 ENCOUNTER — Other Ambulatory Visit: Payer: Self-pay

## 2021-05-01 ENCOUNTER — Inpatient Hospital Stay: Payer: Self-pay | Attending: Hematology and Oncology

## 2021-05-01 DIAGNOSIS — Z86711 Personal history of pulmonary embolism: Secondary | ICD-10-CM | POA: Insufficient documentation

## 2021-05-01 DIAGNOSIS — Z79899 Other long term (current) drug therapy: Secondary | ICD-10-CM | POA: Insufficient documentation

## 2021-05-01 DIAGNOSIS — I2699 Other pulmonary embolism without acute cor pulmonale: Secondary | ICD-10-CM

## 2021-05-01 DIAGNOSIS — Z7901 Long term (current) use of anticoagulants: Secondary | ICD-10-CM | POA: Insufficient documentation

## 2021-05-01 DIAGNOSIS — E119 Type 2 diabetes mellitus without complications: Secondary | ICD-10-CM

## 2021-05-01 LAB — CBC WITH DIFFERENTIAL/PLATELET
Abs Immature Granulocytes: 0.04 10*3/uL (ref 0.00–0.07)
Basophils Absolute: 0.1 10*3/uL (ref 0.0–0.1)
Basophils Relative: 1 %
Eosinophils Absolute: 0.2 10*3/uL (ref 0.0–0.5)
Eosinophils Relative: 2 %
HCT: 47.9 % (ref 39.0–52.0)
Hemoglobin: 16.6 g/dL (ref 13.0–17.0)
Immature Granulocytes: 0 %
Lymphocytes Relative: 31 %
Lymphs Abs: 3.7 10*3/uL (ref 0.7–4.0)
MCH: 30.9 pg (ref 26.0–34.0)
MCHC: 34.7 g/dL (ref 30.0–36.0)
MCV: 89 fL (ref 80.0–100.0)
Monocytes Absolute: 1.4 10*3/uL — ABNORMAL HIGH (ref 0.1–1.0)
Monocytes Relative: 12 %
Neutro Abs: 6.4 10*3/uL (ref 1.7–7.7)
Neutrophils Relative %: 54 %
Platelets: 195 10*3/uL (ref 150–400)
RBC: 5.38 MIL/uL (ref 4.22–5.81)
RDW: 12.8 % (ref 11.5–15.5)
WBC: 11.8 10*3/uL — ABNORMAL HIGH (ref 4.0–10.5)
nRBC: 0 % (ref 0.0–0.2)

## 2021-05-01 LAB — D-DIMER, QUANTITATIVE: D-Dimer, Quant: 0.39 ug/mL-FEU (ref 0.00–0.50)

## 2021-05-02 ENCOUNTER — Telehealth (HOSPITAL_BASED_OUTPATIENT_CLINIC_OR_DEPARTMENT_OTHER): Payer: Self-pay | Admitting: Hematology and Oncology

## 2021-05-02 DIAGNOSIS — Z86711 Personal history of pulmonary embolism: Secondary | ICD-10-CM

## 2021-05-02 NOTE — Assessment & Plan Note (Signed)
The patient had discontinued Eliquis on August 3 He is on preventive dose of 81 mg aspirin for other cardiovascular risk factors His recent D-dimer was normal I recommend he continue aspirin indefinitely He does not need repeat imaging study I do not plan to schedule return follow-up unless he needs perioperative anticoagulation management in the future I reinforced some of the risk factors for recurrent blood clots such as hormonal replacement, dehydration, smoking, prolonged immobility, trauma or surgery

## 2021-05-02 NOTE — Progress Notes (Signed)
HEMATOLOGY-ONCOLOGY ELECTRONIC VISIT PROGRESS NOTE  Patient Care Team: Yvonna Alanis, NP as PCP - General (Adult Health Nurse Practitioner) Heath Lark, MD as Consulting Physician (Hematology and Oncology)  I connected with by [facility] video conference and verified that I am speaking with the correct person using two identifiers.  I discussed the limitations, risks, security and privacy concerns of performing an evaluation and management service by [facility] and the availability of in person appointments.  I also discussed with the patient that there may be a patient responsible charge related to this service. The patient expressed understanding and agreed to proceed.   ASSESSMENT & PLAN:  History of pulmonary embolism The patient had discontinued Eliquis on August 3 He is on preventive dose of 81 mg aspirin for other cardiovascular risk factors His recent D-dimer was normal I recommend he continue aspirin indefinitely He does not need repeat imaging study I do not plan to schedule return follow-up unless he needs perioperative anticoagulation management in the future I reinforced some of the risk factors for recurrent blood clots such as hormonal replacement, dehydration, smoking, prolonged immobility, trauma or surgery  No orders of the defined types were placed in this encounter.   INTERVAL HISTORY: Please see below for problem oriented charting. The purpose of today's discussion is to review recent test results He is doing well He switched over to 81 mg aspirin recently and discontinue Eliquis on August 3 He continues to be motivated with lifestyle changes He is down to 5 units of insulin recently No bleeding complication from aspirin  SUMMARY OF HEMATOLOGIC HISTORY: Please see my original consult note dated 09/25/2020 for further details Richard Mullins was seen here because of thrombocytopenia and recent diagnosis of PE   The patient is a reasonable historian He lives alone by  himself, single with no children He worked part-time with a [employer] He does not have a primary care doctor and has not seen physician for some time He is otherwise healthy   He has not been feeling well since August of last year with feeling unwell, blurriness of vision and nonspecific weight loss He also have frequent urination In 2019, he had diagnosis of kidney stone He admits to poor dietary choices and drink plenty of soda, sweetened beverages and juices He complains of fatigue He denies chest pain or shortness of breath but felt so poorly to the point that he presented to the emergency department several days ago He was found to have uncontrolled diabetes, atrial fibrillation as well as pulmonary embolism We do not have baseline blood work but on admission on 09/20/2020, he has elevated white blood cell count, hemoglobin of 18.6 and a platelet count of 296,000 CT imaging show PE as well as hepatic steatosis He was placed on aggressive insulin regimen for uncontrolled hyperglycemia His first measure blood sugar was over 1000 and he presented with signs of acute kidney injury, hypercalcemia and liver failure   After aggressive management and hydration, his hemoglobin is now 13.2 but he has platelet count trending down to slightly under 100,000.  His liver failure is almost back to normal His serum creatinine has normalized and his calcium are now normal He underwent echocardiogram which showed preserved ejection fraction On monitor today, he appears to be in sinus rhythm He was originally placed on IV heparin but then transitioned to Eliquis   Due to low platelet count, I was consulted for evaluation He has no signs of bleeding since admission   In the  background, he has occasional intermittent right-sided nosebleed and occasional bright red blood per rectum He has not had colonoscopy due to lack of insurance   He denies prior blood or platelet transfusions He had  minor surgery with wisdom tooth extraction in the past He has no prior history of blood clots He has no family history of blood clots He denies testosterone use He is not a smoker The patient stated he does not own a car and typically either walk or bicycle to grocery stores for supplies He is noted to have chronic bilateral lower extremity edema which he attributed to other causes He denies trauma to his lower extremities The patient was subsequently discharged home on Eliquis He took his last dose of Eliquis on August 3 and then switch over to 81 mg aspirin  I have reviewed the past medical history, past surgical history, social history and family history with the patient and they are unchanged from previous note.   REVIEW OF SYSTEMS:   Constitutional: Denies fevers, chills or abnormal weight loss Eyes: Denies blurriness of vision Ears, nose, mouth, throat, and face: Denies mucositis or sore throat Respiratory: Denies cough, dyspnea or wheezes Cardiovascular: Denies palpitation, chest discomfort Gastrointestinal:  Denies nausea, heartburn or change in bowel habits Skin: Denies abnormal skin rashes Lymphatics: Denies new lymphadenopathy or easy bruising Neurological:Denies numbness, tingling or new weaknesses Behavioral/Psych: Mood is stable, no new changes  Extremities: No lower extremity edema All other systems were reviewed with the patient and are negative.  I have reviewed the past medical history, past surgical history, social history and family history with the patient and they are unchanged from previous note.  ALLERGIES:  is allergic to monosodium glutamate.  MEDICATIONS:  Current Outpatient Medications  Medication Sig Dispense Refill   acetaminophen (TYLENOL) 500 MG tablet Take 500 mg by mouth as needed.     Aloe Vera 99 % GEL Apply topically as needed.     aspirin EC 81 MG tablet Take 81 mg by mouth daily. Swallow whole.     atorvastatin (LIPITOR) 20 MG tablet Take 1  tablet by mouth once daily 90 tablet 2   Blood Glucose Monitoring Suppl (RELION CONFIRM GLUCOSE MONITOR) w/Device KIT by Does not apply route as directed.     clotrimazole (LOTRIMIN) 1 % cream Apply 1 application topically as needed (for foot fungus).     insulin aspart protamine - aspart (NOVOLOG MIX 70/30 FLEXPEN) (70-30) 100 UNIT/ML FlexPen Inject 10 Units into the skin 2 (two) times daily. 15 mL 11   Insulin Pen Needle (PEN NEEDLES) 32G X 4 MM MISC 1 Device by Does not apply route 2 (two) times daily. With administration of Novolog     Isopropyl Alcohol (ALCOHOL WIPES EX) Apply topically in the morning and at bedtime.     loperamide (IMODIUM) 2 MG capsule Take by mouth as needed for diarrhea or loose stools.     metFORMIN (GLUCOPHAGE XR) 500 MG 24 hr tablet Take 1 tablet (500 mg total) by mouth daily with breakfast. 30 tablet 3   metoprolol tartrate (LOPRESSOR) 100 MG tablet Take 1 tablet (100 mg total) by mouth 2 (two) times daily. 60 tablet 2   Multiple Vitamin (MULTIVITAMIN ADULT PO) Take 1 tablet by mouth once a week.     triamcinolone (KENALOG) 0.1 % Apply topically 3 (three) times daily. 30 g 0   No current facility-administered medications for this visit.    PHYSICAL EXAMINATION: ECOG PERFORMANCE STATUS: 0 - Asymptomatic  LABORATORY DATA:  I have reviewed the data as listed CMP Latest Ref Rng & Units 04/11/2021 03/17/2021 01/05/2021  Glucose 65 - 99 mg/dL 117(H) 106(H) 101(H)  BUN 7 - 25 mg/dL 24 25(H) 18  Creatinine 0.70 - 1.30 mg/dL 0.87 1.11 0.84  Sodium 135 - 146 mmol/L 140 143 140  Potassium 3.5 - 5.3 mmol/L 4.1 3.9 3.6  Chloride 98 - 110 mmol/L 104 106 104  CO2 20 - 32 mmol/L 24 28 26   Calcium 8.6 - 10.3 mg/dL 9.2 10.1 9.5  Total Protein 6.1 - 8.1 g/dL - - 7.6  Total Bilirubin 0.2 - 1.2 mg/dL - - 1.0  Alkaline Phos 38 - 126 U/L - - -  AST 10 - 35 U/L - - 17  ALT 9 - 46 U/L - - 15    Lab Results  Component Value Date   WBC 11.8 (H) 05/01/2021   HGB 16.6 05/01/2021    HCT 47.9 05/01/2021   MCV 89.0 05/01/2021   PLT 195 05/01/2021   NEUTROABS 6.4 05/01/2021     I discussed the assessment and treatment plan with the patient. The patient was provided an opportunity to ask questions and all were answered. The patient agreed with the plan and demonstrated an understanding of the instructions. The patient was advised to call back or seek an in-person evaluation if the symptoms worsen or if the condition fails to improve as anticipated.    I spent 15 minutes for the appointment reviewing test results, discuss management and coordination of care.  Heath Lark, MD 05/02/2021 10:55 AM

## 2021-05-03 ENCOUNTER — Other Ambulatory Visit: Payer: Self-pay | Admitting: Orthopedic Surgery

## 2021-05-03 DIAGNOSIS — E119 Type 2 diabetes mellitus without complications: Secondary | ICD-10-CM

## 2021-05-03 DIAGNOSIS — Z794 Long term (current) use of insulin: Secondary | ICD-10-CM

## 2021-05-10 ENCOUNTER — Encounter: Payer: Self-pay | Admitting: Orthopedic Surgery

## 2021-05-15 ENCOUNTER — Other Ambulatory Visit: Payer: Self-pay | Admitting: Orthopedic Surgery

## 2021-05-15 DIAGNOSIS — E119 Type 2 diabetes mellitus without complications: Secondary | ICD-10-CM

## 2021-05-15 DIAGNOSIS — Z794 Long term (current) use of insulin: Secondary | ICD-10-CM

## 2021-05-15 MED ORDER — NOVOLOG MIX 70/30 FLEXPEN (70-30) 100 UNIT/ML ~~LOC~~ SUPN: 2.0000 [IU] | PEN_INJECTOR | Freq: Two times a day (BID) | SUBCUTANEOUS | 11 refills | Status: DC

## 2021-06-15 ENCOUNTER — Other Ambulatory Visit: Payer: Self-pay | Admitting: Orthopedic Surgery

## 2021-06-15 DIAGNOSIS — I4891 Unspecified atrial fibrillation: Secondary | ICD-10-CM

## 2021-06-16 ENCOUNTER — Encounter: Payer: Self-pay | Admitting: Orthopedic Surgery

## 2021-06-16 DIAGNOSIS — I4891 Unspecified atrial fibrillation: Secondary | ICD-10-CM

## 2021-06-16 MED ORDER — METOPROLOL TARTRATE 100 MG PO TABS: 100.0000 mg | ORAL_TABLET | Freq: Two times a day (BID) | ORAL | 1 refills | Status: DC

## 2021-07-08 ENCOUNTER — Encounter: Payer: Self-pay | Admitting: Orthopedic Surgery

## 2021-07-17 ENCOUNTER — Other Ambulatory Visit: Payer: Self-pay

## 2021-07-17 DIAGNOSIS — E785 Hyperlipidemia, unspecified: Secondary | ICD-10-CM

## 2021-07-17 DIAGNOSIS — Z794 Long term (current) use of insulin: Secondary | ICD-10-CM

## 2021-07-17 DIAGNOSIS — E1169 Type 2 diabetes mellitus with other specified complication: Secondary | ICD-10-CM

## 2021-07-17 DIAGNOSIS — E119 Type 2 diabetes mellitus without complications: Secondary | ICD-10-CM

## 2021-07-20 ENCOUNTER — Ambulatory Visit (INDEPENDENT_AMBULATORY_CARE_PROVIDER_SITE_OTHER): Payer: Self-pay | Admitting: Orthopedic Surgery

## 2021-07-20 ENCOUNTER — Other Ambulatory Visit: Payer: Self-pay

## 2021-07-20 ENCOUNTER — Encounter: Payer: Self-pay | Admitting: Orthopedic Surgery

## 2021-07-20 VITALS — BP 148/84 | HR 75 | Temp 96.8°F | Resp 15 | Ht 75.0 in | Wt 342.6 lb

## 2021-07-20 DIAGNOSIS — Z6839 Body mass index (BMI) 39.0-39.9, adult: Secondary | ICD-10-CM

## 2021-07-20 DIAGNOSIS — I4891 Unspecified atrial fibrillation: Secondary | ICD-10-CM

## 2021-07-20 DIAGNOSIS — Z86711 Personal history of pulmonary embolism: Secondary | ICD-10-CM

## 2021-07-20 DIAGNOSIS — Z23 Encounter for immunization: Secondary | ICD-10-CM

## 2021-07-20 DIAGNOSIS — E1169 Type 2 diabetes mellitus with other specified complication: Secondary | ICD-10-CM

## 2021-07-20 DIAGNOSIS — Z794 Long term (current) use of insulin: Secondary | ICD-10-CM

## 2021-07-20 DIAGNOSIS — L853 Xerosis cutis: Secondary | ICD-10-CM

## 2021-07-20 DIAGNOSIS — K219 Gastro-esophageal reflux disease without esophagitis: Secondary | ICD-10-CM

## 2021-07-20 DIAGNOSIS — K644 Residual hemorrhoidal skin tags: Secondary | ICD-10-CM

## 2021-07-20 DIAGNOSIS — E785 Hyperlipidemia, unspecified: Secondary | ICD-10-CM

## 2021-07-20 DIAGNOSIS — E119 Type 2 diabetes mellitus without complications: Secondary | ICD-10-CM

## 2021-07-20 DIAGNOSIS — E663 Overweight: Secondary | ICD-10-CM

## 2021-07-20 LAB — HEMOGLOBIN A1C
Hgb A1c MFr Bld: 6.1 % of total Hgb — ABNORMAL HIGH (ref <5.7)
Mean Plasma Glucose: 128 mg/dL
eAG (mmol/L): 7.1 mmol/L

## 2021-07-20 LAB — LIPID PANEL
Cholesterol: 109 mg/dL (ref <200)
HDL: 41 mg/dL (ref >=40)
LDL Cholesterol (Calc): 52 mg/dL (calc)
Non-HDL Cholesterol (Calc): 68 mg/dL (calc) (ref <130)
Total CHOL/HDL Ratio: 2.7 (calc) (ref <5.0)
Triglycerides: 75 mg/dL (ref <150)

## 2021-07-20 LAB — TEST AUTHORIZATION

## 2021-07-20 LAB — TSH: TSH: 4.1 mIU/L (ref 0.40–4.50)

## 2021-07-20 NOTE — Progress Notes (Signed)
Careteam: Patient Care Team: Richard Alanis, NP as PCP - General (Adult Health Nurse Practitioner) Heath Lark, MD as Consulting Physician (Hematology and Oncology)  Seen by: Windell Moulding, AGNP-C  PLACE OF SERVICE:  [place] Directive information    Allergies  Allergen Reactions   Monosodium Glutamate Diarrhea, Hives and Nausea And Vomiting    No chief complaint on file.    HPI: Patient is a 55 y.o. male seen today for medical management of chronic conditions.   Labs discussed with patient.   Diabetes- he is off insulin at this time. Taking metformin daily. No recent hypoglycemic events. Still checking sugars daily, 120 earlier this morning.   GERD- improved since last visit, believes it is from over eating. Not taking any medication at this time.   Weight- gained about 12 lbs since July, 2022. Food has been tasting better making him over eat at times. Still riding bike everyday.   Hx pulmonary embolism- saw Dr. Alvy Bimler 04/21/12/2022, advised staying on aspirin 81 mg daily indefinitely. Still unclear what caused PE earlier this year. He has not been evaluated by cardiology.   Afib- he would like to be off metoprolol. He is evaluating new insurance plans at this time. Interested in seeing cardiology in future to evaluate afib further.   Hemorrhoids- reports rectal pain today. He will use aloe and ibuprofen for pain. Denies hard stools. Treatment options discussed.   Dry skin- itching skin to back. He is applying aloe to area. Uses humidifier in house.       Review of Systems:  Review of Systems  Constitutional:  Negative for chills, fever, malaise/fatigue and weight loss.  HENT:  Negative for congestion and sore throat.   Eyes:  Negative for blurred vision and double vision.       Glasses  Respiratory:  Negative for cough, shortness of breath and wheezing.   Cardiovascular:  Negative for chest pain and leg swelling.  Gastrointestinal:  Negative for  abdominal pain, blood in stool, constipation, diarrhea, heartburn, nausea and vomiting.       Hemorrhoids  Genitourinary:  Negative for dysuria, frequency and hematuria.  Musculoskeletal:  Negative for falls, joint pain and myalgias.  Skin: Negative.        Dry skin  Neurological:  Negative for dizziness, weakness and headaches.  Psychiatric/Behavioral:  Negative for depression. The patient is not nervous/anxious and does not have insomnia.    Past Medical History:  Diagnosis Date   Atrial fibrillation ([place]) 01/65/5374   Complication of anesthesia    DURING DENTAL PROCEDURE   Diabetes mellitus without complication (HCC)    Gastroesophageal reflux disease    History of blood clots 09/20/2020   History of COVID-19 2022   PONV (postoperative nausea and vomiting)    Psoriasis    Past Surgical History:  Procedure Laterality Date   ingrown toenails  1995   WISDOM TOOTH EXTRACTION     Social History:   reports that he has never smoked. He has never used smokeless tobacco. He reports current alcohol use. He reports that he does not currently use drugs.  Family History  Problem Relation Age of Onset   Lung cancer Father    Colon cancer Sister    Heart attack Maternal Uncle 98   High blood pressure Sister    ADD / ADHD Sister    Diabetes Sister     Medications: Patient's Medications  New Prescriptions   No medications on file  Previous Medications  ACETAMINOPHEN (TYLENOL) 500 MG TABLET    Take 500 mg by mouth as needed.   ALOE VERA 99 % GEL    Apply topically as needed.   ASPIRIN EC 81 MG TABLET    Take 81 mg by mouth daily. Swallow whole.   ATORVASTATIN (LIPITOR) 20 MG TABLET    Take 1 tablet by mouth once daily   BLOOD GLUCOSE MONITORING SUPPL (RELION CONFIRM GLUCOSE MONITOR) W/DEVICE KIT    by Does not apply route as directed.   CLOTRIMAZOLE (LOTRIMIN) 1 % CREAM    Apply 1 application topically as needed (for foot fungus).   INSULIN ASPART PROTAMINE - ASPART (NOVOLOG MIX  70/30 FLEXPEN) (70-30) 100 UNIT/ML FLEXPEN    Inject 2 Units into the skin 2 (two) times daily.   INSULIN PEN NEEDLE (PEN NEEDLES) 32G X 4 MM MISC    1 Device by Does not apply route 2 (two) times daily. With administration of Novolog   ISOPROPYL ALCOHOL (ALCOHOL WIPES EX)    Apply topically in the morning and at bedtime.   LOPERAMIDE (IMODIUM) 2 MG CAPSULE    Take by mouth as needed for diarrhea or loose stools.   METFORMIN (GLUCOPHAGE-XR) 500 MG 24 HR TABLET    Take 1 tablet by mouth once daily with breakfast   METOPROLOL TARTRATE (LOPRESSOR) 100 MG TABLET    Take 1 tablet (100 mg total) by mouth 2 (two) times daily.   MULTIPLE VITAMIN (MULTIVITAMIN ADULT PO)    Take 1 tablet by mouth once a week.   TRIAMCINOLONE (KENALOG) 0.1 %    Apply topically 3 (three) times daily.  Modified Medications   No medications on file  Discontinued Medications   No medications on file    Physical Exam:  There were no vitals filed for this visit. There is no height or weight on file to calculate BMI. Wt Readings from Last 3 Encounters:  04/13/21 (!) 338 lb (153.3 kg)  03/17/21 (!) 330 lb (149.7 kg)  01/05/21 (!) 318 lb 9.6 oz (144.5 kg)    Physical Exam Vitals reviewed.  Constitutional:      General: He is not in acute distress.    Appearance: He is obese.  HENT:     Head: Normocephalic.  Eyes:     General:        Right eye: No discharge.        Left eye: No discharge.  Neck:     Thyroid: No thyroid mass or thyromegaly.     Vascular: No carotid bruit.  Cardiovascular:     Rate and Rhythm: Normal rate and regular rhythm.     Pulses: Normal pulses.     Heart sounds: Normal heart sounds. No murmur heard. Pulmonary:     Effort: Pulmonary effort is normal. No respiratory distress.     Breath sounds: Normal breath sounds. No wheezing.  Abdominal:     General: Bowel sounds are normal. There is no distension.     Palpations: Abdomen is soft.     Tenderness: There is no abdominal tenderness.   Musculoskeletal:     Cervical back: Normal range of motion.     Right lower leg: Edema present.     Left lower leg: Edema present.     Comments: Non-pitting  Lymphadenopathy:     Cervical: No cervical adenopathy.  Skin:    General: Skin is warm and dry.     Capillary Refill: Capillary refill takes less than 2 seconds.  Neurological:  General: No focal deficit present.     Mental Status: He is alert and oriented to person, place, and time.  Psychiatric:        Mood and Affect: Mood normal.        Behavior: Behavior normal.    Labs reviewed: Basic Metabolic Panel: Recent Labs    09/22/20 0242 09/22/20 0243 09/23/20 1108 09/24/20 0457 09/25/20 0145 01/05/21 0935 03/17/21 1133 04/11/21 0826  NA  --   --  142 141   < > 140 143 140  K 3.3*  --  3.6 3.6   < > 3.6 3.9 4.1  CL  --   --  110 108   < > 104 106 104  CO2  --   --  21* 24   < > 26 28 24   GLUCOSE  --   --  312* 186*   < > 101* 106* 117*  BUN  --   --  19 14   < > 18 25* 24  CREATININE  --   --  0.92 0.90   < > 0.84 1.11 0.87  CALCIUM  --   --  8.8* 8.3*   < > 9.5 10.1 9.2  MG 2.4  --  2.1 2.0  --   --   --   --   TSH  --  0.250*  --   --   --   --   --   --    < > = values in this interval not displayed.   Liver Function Tests: Recent Labs    09/20/20 1010 09/25/20 0145 01/05/21 0935  AST 32 55* 17  ALT 54* 59* 15  ALKPHOS 65 42  --   BILITOT 2.2* 1.0 1.0  PROT 8.5* 5.6* 7.6  ALBUMIN 4.0 2.5*  --    Recent Labs    09/20/20 1010  LIPASE 48   No results for input(s): AMMONIA in the last 8760 hours. CBC: Recent Labs    03/17/21 1133 04/11/21 0826 05/01/21 1117  WBC 11.4* 10.2 11.8*  NEUTROABS 6.3 5,590 6.4  HGB 16.8 17.2* 16.6  HCT 49.4 50.6* 47.9  MCV 89.8 90.5 89.0  PLT 197 216 195   Lipid Panel: Recent Labs    01/05/21 0935 07/17/21 0816  CHOL 144 109  HDL 41 41  LDLCALC 89 52  TRIG 48 75  CHOLHDL 3.5 2.7   TSH: Recent Labs    09/22/20 0243  TSH 0.250*   A1C: Lab  Results  Component Value Date   HGBA1C 6.1 (H) 07/17/2021     Assessment/Plan 1. Type 2 diabetes mellitus without complication, with long-term current use of insulin (HCC) - off insulin - A1c 6.1 07/17/2021 - cont metformin, asa, and statin - discussed adding ACE/ARB for kidney protection- would like to address next year - cont to limit carbs and sugars from diet - eye exam- future - urine microalbumin- refused   2. Hyperlipidemia associated with type 2 diabetes mellitus ([place]) - LDL 52 07/17/2021- at goal < 70 - cont statin  3. Atrial fibrillation, unspecified type ([place]) - diagnosed last hospitalization 09/2020 - remains on metoprolol - recommend cardiology referral- refused today - cont aspiring  4. History of pulmonary embolism - followed by Dr. Alvy Bimler - etiology unknown - advised to remain on aspirin indefinitely  5. Overweight - up 12 lbs from last visit - admits to over eating - discussed calorie counting - exercises daily - TSH  6. BMI 39.0-39.9,adult -  see above - TSH  7. Gastroesophageal reflux disease without esophagitis - resolved  8. Need for pneumococcal vaccine - not interested in today  9. Need for immunization against influenza - Flu Vaccine QUAD 15moIM (Fluarix, Fluzone & Alfiuria Quad PF)  10. External hemorrhoids - intermittent, denies hard stools - recommend using witch hazel- apply to cotton round and apply over rectum nightly - use stool softener if stools become hard  11. Dry skin - dry skin to back, c/o itching - hx of psoriasis - recommend applying lotion to skin after bathing - recommend using humidifier in bedroom every night  Total time: 35 minutes. Greater than 50%of total time spent doing patient education regarding diabetes management, weight loss and hemorrhoid treatment.    Next appt: 11/23/2021  AWindell Moulding AH[facility] [phone]

## 2021-07-20 NOTE — Patient Instructions (Addendum)
Witch hazel- apply to cotton round and place over rectum and sleep overnight with it  Can also try anusol for bad rectal pain  Purchase stool softener if stools become hard  Apply lotion to skin after bathing  Use humidifier while sleeping at night  Pfizer covid booster

## 2021-07-21 DIAGNOSIS — K649 Unspecified hemorrhoids: Secondary | ICD-10-CM | POA: Insufficient documentation

## 2021-08-07 ENCOUNTER — Encounter: Payer: Self-pay | Admitting: Orthopedic Surgery

## 2021-10-23 ENCOUNTER — Other Ambulatory Visit: Payer: Self-pay | Admitting: Orthopedic Surgery

## 2021-10-23 DIAGNOSIS — Z794 Long term (current) use of insulin: Secondary | ICD-10-CM

## 2021-10-23 DIAGNOSIS — E119 Type 2 diabetes mellitus without complications: Secondary | ICD-10-CM

## 2021-11-08 ENCOUNTER — Other Ambulatory Visit: Payer: Self-pay | Admitting: Orthopedic Surgery

## 2021-11-08 DIAGNOSIS — E1169 Type 2 diabetes mellitus with other specified complication: Secondary | ICD-10-CM

## 2021-11-08 DIAGNOSIS — E119 Type 2 diabetes mellitus without complications: Secondary | ICD-10-CM

## 2021-11-08 DIAGNOSIS — I4891 Unspecified atrial fibrillation: Secondary | ICD-10-CM

## 2021-11-21 ENCOUNTER — Other Ambulatory Visit: Payer: Self-pay

## 2021-11-21 DIAGNOSIS — E119 Type 2 diabetes mellitus without complications: Secondary | ICD-10-CM

## 2021-11-21 DIAGNOSIS — E1169 Type 2 diabetes mellitus with other specified complication: Secondary | ICD-10-CM

## 2021-11-21 DIAGNOSIS — Z794 Long term (current) use of insulin: Secondary | ICD-10-CM

## 2021-11-21 DIAGNOSIS — I4891 Unspecified atrial fibrillation: Secondary | ICD-10-CM

## 2021-11-22 ENCOUNTER — Encounter: Payer: Self-pay | Admitting: Orthopedic Surgery

## 2021-11-22 ENCOUNTER — Other Ambulatory Visit: Payer: Self-pay | Admitting: Orthopedic Surgery

## 2021-11-22 DIAGNOSIS — I4891 Unspecified atrial fibrillation: Secondary | ICD-10-CM

## 2021-11-22 LAB — COMPREHENSIVE METABOLIC PANEL
AG Ratio: 1.3 (calc) (ref 1.0–2.5)
ALT: 32 U/L (ref 9–46)
AST: 19 U/L (ref 10–35)
Albumin: 4.2 g/dL (ref 3.6–5.1)
Alkaline phosphatase (APISO): 49 U/L (ref 35–144)
BUN/Creatinine Ratio: 27 (calc) — ABNORMAL HIGH (ref 6–22)
BUN: 26 mg/dL — ABNORMAL HIGH (ref 7–25)
CO2: 26 mmol/L (ref 20–32)
Calcium: 9.4 mg/dL (ref 8.6–10.3)
Chloride: 102 mmol/L (ref 98–110)
Creat: 0.95 mg/dL (ref 0.70–1.30)
Globulin: 3.3 g/dL (calc) (ref 1.9–3.7)
Glucose, Bld: 99 mg/dL (ref 65–99)
Potassium: 3.5 mmol/L (ref 3.5–5.3)
Sodium: 138 mmol/L (ref 135–146)
Total Bilirubin: 0.5 mg/dL (ref 0.2–1.2)
Total Protein: 7.5 g/dL (ref 6.1–8.1)

## 2021-11-22 LAB — MICROALBUMIN / CREATININE URINE RATIO
Creatinine, Urine: 136 mg/dL (ref 20–320)
Microalb Creat Ratio: 53 mcg/mg creat — ABNORMAL HIGH (ref <30)
Microalb, Ur: 7.2 mg/dL

## 2021-11-22 LAB — HEMOGLOBIN A1C
Hgb A1c MFr Bld: 6.3 % of total Hgb — ABNORMAL HIGH (ref <5.7)
Mean Plasma Glucose: 134 mg/dL
eAG (mmol/L): 7.4 mmol/L

## 2021-11-22 LAB — CBC WITH DIFFERENTIAL/PLATELET
Absolute Monocytes: 1081 cells/uL — ABNORMAL HIGH (ref 200–950)
Basophils Absolute: 41 cells/uL (ref 0–200)
Basophils Relative: 0.4 %
Eosinophils Absolute: 173 cells/uL (ref 15–500)
Eosinophils Relative: 1.7 %
HCT: 51 % — ABNORMAL HIGH (ref 38.5–50.0)
Hemoglobin: 17.3 g/dL — ABNORMAL HIGH (ref 13.2–17.1)
Lymphs Abs: 3845 cells/uL (ref 850–3900)
MCH: 30.6 pg (ref 27.0–33.0)
MCHC: 33.9 g/dL (ref 32.0–36.0)
MCV: 90.3 fL (ref 80.0–100.0)
MPV: 9.8 fL (ref 7.5–12.5)
Monocytes Relative: 10.6 %
Neutro Abs: 5059 cells/uL (ref 1500–7800)
Neutrophils Relative %: 49.6 %
Platelets: 218 10*3/uL (ref 140–400)
RBC: 5.65 10*6/uL (ref 4.20–5.80)
RDW: 13.6 % (ref 11.0–15.0)
Total Lymphocyte: 37.7 %
WBC: 10.2 10*3/uL (ref 3.8–10.8)

## 2021-11-23 ENCOUNTER — Ambulatory Visit (INDEPENDENT_AMBULATORY_CARE_PROVIDER_SITE_OTHER): Payer: Self-pay | Admitting: Orthopedic Surgery

## 2021-11-23 ENCOUNTER — Encounter: Payer: Self-pay | Admitting: Orthopedic Surgery

## 2021-11-23 VITALS — BP 138/88 | HR 86 | Temp 97.5°F | Ht 75.0 in | Wt 341.4 lb

## 2021-11-23 DIAGNOSIS — K644 Residual hemorrhoidal skin tags: Secondary | ICD-10-CM

## 2021-11-23 DIAGNOSIS — R809 Proteinuria, unspecified: Secondary | ICD-10-CM

## 2021-11-23 DIAGNOSIS — E663 Overweight: Secondary | ICD-10-CM

## 2021-11-23 DIAGNOSIS — I4891 Unspecified atrial fibrillation: Secondary | ICD-10-CM

## 2021-11-23 DIAGNOSIS — E785 Hyperlipidemia, unspecified: Secondary | ICD-10-CM

## 2021-11-23 DIAGNOSIS — Z6839 Body mass index (BMI) 39.0-39.9, adult: Secondary | ICD-10-CM

## 2021-11-23 DIAGNOSIS — E1169 Type 2 diabetes mellitus with other specified complication: Secondary | ICD-10-CM

## 2021-11-23 DIAGNOSIS — Z86711 Personal history of pulmonary embolism: Secondary | ICD-10-CM

## 2021-11-23 DIAGNOSIS — E1129 Type 2 diabetes mellitus with other diabetic kidney complication: Secondary | ICD-10-CM

## 2021-11-23 MED ORDER — LOSARTAN POTASSIUM 25 MG PO TABS: 25.0000 mg | ORAL_TABLET | Freq: Every day | ORAL | 1 refills | Status: DC

## 2021-11-23 MED ORDER — ASPIRIN 81 MG PO TBEC: 81.0000 mg | DELAYED_RELEASE_TABLET | Freq: Every day | ORAL | 12 refills | Status: DC

## 2021-11-23 NOTE — Patient Instructions (Addendum)
Losartan prescription sent to pharmacy- medication needed for diabetic kidney protection ? ?Continue taking aspirin daily ? ?Recommend stool softener daily for 1 month.  ? [facility]- [phone] ?

## 2021-11-23 NOTE — Progress Notes (Signed)
? ? ?Careteam: ?Patient Care Team: ?Yvonna Alanis, NP as PCP - General (Adult Health Nurse Practitioner) ?Heath Lark, MD as Consulting Physician (Hematology and Oncology) ? ?Seen by: Windell Moulding, AGNP-C ? ?PLACE OF SERVICE:  ?[facility]  ?Advanced Directive information ?Does Patient Have a Medical Advance Directive?: No, Would patient like information on creating a medical advance directive?: No - Patient declined ? ?Allergies  ?Allergen Reactions  ? Monosodium Glutamate Diarrhea, Hives and Nausea And Vomiting  ? ? ?Chief Complaint  ?Patient presents with  ? Medical Management of Chronic Issues  ?  Patient present today for 4 month follow-up and review recent labs.  ? Quality Metric Gaps  ?  Discuss the need for Shingrix vaccine and additional Covid booster, or post pone if patient refuses.   ? ? ? ?HPI: Patient is a 56 y.o. male seen today for medical management of chronic conditions.  ? ?Labs reviewed with patient.  ? ?Afib with RVR diagnosed 09/2020 hospitalization. He was also diagnosed with PE. Discharged on metoprolol and Eliquis. Dr. Alvy Bimler discontinued Eliquis 03/2021. He has never follow up with cardiology after diagnosis. Requesting cardiology referral to follow up on atrial fib. He has been taking aspirin 81 mg daily since being off Eliquis. Denies chest pain or sob.  ? ?Micro albumin ratio elevated. Discussed starting ARB for kidney protection.  ? ?Continues to check blood sugar twice daily. Blood sugars averaging 100-130's. No hypoglycemias.  ? ?Reports recent issue with hemorrhoids. Describes hard stools. He has started stool softener.  ? ?Multiple skin tag around waistline. They are becoming longer. Requesting dermatology referral.  ? ? ? ? ? ? ? ?Review of Systems:  ?Review of Systems  ?Constitutional:  Negative for chills, fever, malaise/fatigue and weight loss.  ?HENT:  Negative for congestion and sore throat.   ?Eyes:  Negative for blurred vision and double vision.  ?Respiratory:  Negative for  cough, shortness of breath and wheezing.   ?Cardiovascular:  Negative for chest pain, palpitations and leg swelling.  ?Gastrointestinal:  Negative for abdominal pain, constipation, diarrhea, heartburn, nausea and vomiting.  ?     Hemorrhoids  ?Genitourinary:  Negative for frequency and hematuria.  ?Musculoskeletal:  Negative for falls.  ?Skin:   ?     Skin tags  ?Neurological:  Negative for dizziness, weakness and headaches.  ?Psychiatric/Behavioral:  Negative for depression. The patient is not nervous/anxious and does not have insomnia.   ? ?Past Medical History:  ?Diagnosis Date  ? Atrial fibrillation ([place]) 09/20/2020  ? Complication of anesthesia   ? DURING DENTAL PROCEDURE  ? Diabetes mellitus without complication ([place])   ? Gastroesophageal reflux disease   ? History of blood clots 09/20/2020  ? History of COVID-19 2022  ? PONV (postoperative nausea and vomiting)   ? Psoriasis   ? ?Past Surgical History:  ?Procedure Laterality Date  ? ingrown toenails  1995  ? WISDOM TOOTH EXTRACTION    ? ?Social History: ?  reports that he has never smoked. He has never used smokeless tobacco. He reports current alcohol use. He reports that he does not currently use drugs. ? ?Family History  ?Problem Relation Age of Onset  ? Lung cancer Father   ? Colon cancer Sister   ? Heart attack Maternal Uncle 70  ? High blood pressure Sister   ? ADD / ADHD Sister   ? Diabetes Sister   ? ? ?Medications: ?Patient's Medications  ?New Prescriptions  ? No medications on file  ?Previous Medications  ?  ACETAMINOPHEN (TYLENOL) 500 MG TABLET    Take 500 mg by mouth as needed.  ? ALOE VERA 99 % GEL    Apply topically as needed.  ? ASPIRIN EC 81 MG TABLET    Take 81 mg by mouth daily. Swallow whole.  ? ATORVASTATIN (LIPITOR) 20 MG TABLET    Take 1 tablet by mouth once daily  ? BLOOD GLUCOSE MONITORING SUPPL (RELION CONFIRM GLUCOSE MONITOR) W/DEVICE KIT    by Does not apply route as directed.  ? CLOTRIMAZOLE (LOTRIMIN) 1 % CREAM    Apply 1  application topically as needed (for foot fungus).  ? ISOPROPYL ALCOHOL (ALCOHOL WIPES EX)    Apply topically in the morning and at bedtime.  ? LOPERAMIDE (IMODIUM) 2 MG CAPSULE    Take by mouth as needed for diarrhea or loose stools.  ? METFORMIN (GLUCOPHAGE-XR) 500 MG 24 HR TABLET    Take 1 tablet by mouth once daily with breakfast  ? METOPROLOL TARTRATE (LOPRESSOR) 100 MG TABLET    Take 1 tablet (100 mg total) by mouth 2 (two) times daily.  ? MULTIPLE VITAMIN (MULTIVITAMIN ADULT PO)    Take 1 tablet by mouth once a week.  ? TRIAMCINOLONE (KENALOG) 0.1 %    Apply topically 3 (three) times daily.  ?Modified Medications  ? No medications on file  ?Discontinued Medications  ? No medications on file  ? ? ?Physical Exam: ? ?There were no vitals filed for this visit. ?There is no height or weight on file to calculate BMI. ?Wt Readings from Last 3 Encounters:  ?07/20/21 (!) 342 lb 9.6 oz (155.4 kg)  ?04/13/21 (!) 338 lb (153.3 kg)  ?03/17/21 (!) 330 lb (149.7 kg)  ? ? ?Physical Exam ?Vitals reviewed.  ?Constitutional:   ?   General: He is not in acute distress. ?   Appearance: He is obese.  ?HENT:  ?   Head: Normocephalic.  ?Eyes:  ?   General:     ?   Right eye: No discharge.     ?   Left eye: No discharge.  ?Cardiovascular:  ?   Rate and Rhythm: Normal rate and regular rhythm.  ?   Pulses: Normal pulses.  ?   Heart sounds: Normal heart sounds.  ?Pulmonary:  ?   Effort: Pulmonary effort is normal. No respiratory distress.  ?   Breath sounds: Normal breath sounds. No wheezing.  ?Abdominal:  ?   General: Bowel sounds are normal. There is no distension.  ?   Palpations: Abdomen is soft.  ?   Tenderness: There is no abdominal tenderness.  ?Musculoskeletal:  ?   Cervical back: Neck supple.  ?   Right lower leg: No edema.  ?   Left lower leg: No edema.  ?Skin: ?   General: Skin is warm and dry.  ?   Capillary Refill: Capillary refill takes less than 2 seconds.  ?Neurological:  ?   General: No focal deficit present.  ?    Mental Status: He is alert and oriented to person, place, and time.  ?Psychiatric:     ?   Mood and Affect: Mood normal.     ?   Behavior: Behavior normal.  ? ? ?Labs reviewed: ?Basic Metabolic Panel: ?Recent Labs  ?  03/17/21 ?1133 04/11/21 ?0826 07/17/21 ?6599 11/21/21 ?3570  ?NA 143 140  --  138  ?K 3.9 4.1  --  3.5  ?CL 106 104  --  102  ?CO2 28 24  --  26  ?GLUCOSE 106* 117*  --  99  ?BUN 25* 24  --  26*  ?CREATININE 1.11 0.87  --  0.95  ?CALCIUM 10.1 9.2  --  9.4  ?TSH  --   --  4.10  --   ? ?Liver Function Tests: ?Recent Labs  ?  01/05/21 ?5364 11/21/21 ?6803  ?AST 17 19  ?ALT 15 32  ?BILITOT 1.0 0.5  ?PROT 7.6 7.5  ? ?No results for input(s): LIPASE, AMYLASE in the last 8760 hours. ?No results for input(s): AMMONIA in the last 8760 hours. ?CBC: ?Recent Labs  ?  04/11/21 ?0826 05/01/21 ?1117 11/21/21 ?0812  ?WBC 10.2 11.8* 10.2  ?NEUTROABS 5,590 6.4 5,059  ?HGB 17.2* 16.6 17.3*  ?HCT 50.6* 47.9 51.0*  ?MCV 90.5 89.0 90.3  ?PLT 216 195 218  ? ?Lipid Panel: ?Recent Labs  ?  01/05/21 ?2122 07/17/21 ?0816  ?CHOL 144 109  ?HDL 41 41  ?LDLCALC 89 52  ?TRIG 48 75  ?CHOLHDL 3.5 2.7  ? ?TSH: ?Recent Labs  ?  07/17/21 ?0816  ?TSH 4.10  ? ?A1C: ?Lab Results  ?Component Value Date  ? HGBA1C 6.3 (H) 11/21/2021  ? ? ? ?Assessment/Plan ?1. Atrial fibrillation, unspecified type ([place]) ?- diagnosed 09/2020 ?- off Eliquis 03/2021 per Dr. Alvy Bimler ?- cont metoprolol ?- cont asa ?- cardiology referral made ? ?2. Type 2 diabetes mellitus with microalbuminuria, without long-term current use of insulin (HCC) ?- A1c 6.3 04/04 ?- microalbumin ratio 53 0/04 ?- start ARB for kidney protection ?- cont metformin, asa and statin ?- losartan (COZAAR) 25 MG tablet; Take 1 tablet (25 mg total) by mouth daily.  Dispense: 90 tablet; Refill: 1 ?- Microalbumin/Creatinine Ratio, Urine; Future ?- Hemoglobin A1c; Future ? ?3. Hyperlipidemia associated with type 2 diabetes mellitus ([place]) ?- LDL 52 07/17/2021- at goal ?- cont statin ? ?4. History of  pulmonary embolism ?- diagnosed 09/2020 ?- followed by hematology ?- off Eliquis 03/2021 ?- cont asa ? ?5. Morbid obesity ?- BMI 42.67 ?- discussed weight loss strategies ?- calories < 2000/day ?- exercise 150 min/week ?

## 2021-11-27 ENCOUNTER — Other Ambulatory Visit: Payer: Self-pay | Admitting: Orthopedic Surgery

## 2021-11-27 ENCOUNTER — Encounter: Payer: Self-pay | Admitting: Orthopedic Surgery

## 2021-11-27 DIAGNOSIS — I4891 Unspecified atrial fibrillation: Secondary | ICD-10-CM

## 2021-12-11 NOTE — Progress Notes (Signed)
?Cardiology Office Note:   ? ?Date:  12/13/2021  ? ?ID:  Richard Mullins, DOB 12/29/1965, MRN [id number] ? ?PCP:  Yvonna Alanis, NP  ?Cardiologist:  Sinclair Grooms, MD  ? ?Referring MD: Yvonna Alanis, NP  ? ?No chief complaint on file. ? ? ?History of Present Illness:   ? ?Richard Mullins is a 56 y.o. male with a hx of DM II, h/o pulmonary embolism, and h/o atrial fibrillation 09/2020 in setting of PE. ? ?After AF he discharged on Metoprolol and Eliquis 03/2021. Patient never had f/u after discharge and has requested cardiology opinion.. ? ?In February 2022, he was admitted with severe blood sugar elevation.  He had COVID-19 infection 2 weeks prior to the admission.  During the hospital stay he was found to have bilateral pulmonary emboli.  IV heparin and Eliquis therapy was used.  During the hospital stay he was noted to have atrial fibrillation but it is difficult to tell how long it lasted.  The assumption seems to be that atrial fibrillation was acute disease related and not a persistent recurring problem.  The patient remembers when the identified atrial fibrillation is states that he felt nothing that would indicate anything was wrong.  Therefore if he has recurrent atrial fibrillation he is unlikely to recognize it. ? ?Past Medical History:  ?Diagnosis Date  ? Atrial fibrillation ([place]) 09/20/2020  ? Complication of anesthesia   ? DURING DENTAL PROCEDURE  ? Diabetes mellitus without complication ([place])   ? Gastroesophageal reflux disease   ? History of blood clots 09/20/2020  ? History of COVID-19 2022  ? PONV (postoperative nausea and vomiting)   ? Psoriasis   ? ? ?Past Surgical History:  ?Procedure Laterality Date  ? ingrown toenails  1995  ? WISDOM TOOTH EXTRACTION    ? ? ?Current Medications: ?Current Meds  ?Medication Sig  ? acetaminophen (TYLENOL) 500 MG tablet Take 500 mg by mouth as needed.  ? Aloe Vera 99 % GEL Apply topically as needed.  ? aspirin EC 81 MG tablet Take 81 mg by mouth daily. Swallow whole.  Pt takes in the morning.  ? atorvastatin (LIPITOR) 20 MG tablet Take 1 tablet by mouth once daily (Patient taking differently: Pt takes in the evening.)  ? Blood Glucose Monitoring Suppl (RELION CONFIRM GLUCOSE MONITOR) w/Device KIT by Does not apply route as directed.  ? Casanthranol-Docusate Sodium (STOOL SOFTENER PLUS PO) Take by mouth daily.  ? clotrimazole (LOTRIMIN) 1 % cream Apply 1 application topically as needed (for foot fungus).  ? Isopropyl Alcohol (ALCOHOL WIPES EX) Apply topically in the morning and at bedtime.  ? loperamide (IMODIUM) 2 MG capsule Take by mouth as needed for diarrhea or loose stools.  ? losartan (COZAAR) 25 MG tablet Take 1 tablet (25 mg total) by mouth daily.  ? metFORMIN (GLUCOPHAGE-XR) 500 MG 24 hr tablet Take 1 tablet by mouth once daily with breakfast (Patient taking differently: 500 mg.)  ? metoprolol tartrate (LOPRESSOR) 100 MG tablet Take 1 tablet by mouth twice daily  ? Multiple Vitamin (MULTIVITAMIN ADULT PO) Take 1 tablet by mouth once a week.  ? triamcinolone (KENALOG) 0.1 % Apply topically 3 (three) times daily.  ?  ? ?Allergies:   Bee pollen and Monosodium glutamate  ? ?Social History  ? ?Socioeconomic History  ? Marital status: Single  ?  Spouse name: Not on file  ? Number of children: Not on file  ? Years of education: Not on file  ? Highest  education level: Not on file  ?Occupational History  ? Not on file  ?Tobacco Use  ? Smoking status: Never  ? Smokeless tobacco: Never  ?Vaping Use  ? Vaping Use: Never used  ?Substance and Sexual Activity  ? Alcohol use: Yes  ?  Comment: 1 per week  ? Drug use: Not Currently  ? Sexual activity: Yes  ?Other Topics Concern  ? Not on file  ?Social History Narrative  ? Diet: low sodium & high fiber  ?   ? Caffeine: none  ?   ? Married, if yes what year: no  ?   ? Do you live in a house, apartment, assisted living, condo, trailer, ect: Apartment  ?   ? Is it one or more stories: 2  ?   ? How many persons live in your home? 1  ?   ? Pets:  none  ?   ? Highest level or education completed: Master's degree   ?   ? Current/Past profession: self employed  ?   ? Exercise: Yes               Type and how often: daily, bicycling (5-10 miles)  ?   ?   ? Living Will: No  ? DNR: No  ? POA/HPOA: No  ?   ? Functional Status:   ? Do you have difficulty bathing or dressing yourself? No  ? Do you have difficulty preparing food or eating? No  ? Do you have difficulty managing your medications? No  ? Do you have difficulty managing your finances? No  ? Do you have difficulty affording your medications? Yes  ? ?Social Determinants of Health  ? ?Financial Resource Strain: Not on file  ?Food Insecurity: Not on file  ?Transportation Needs: Not on file  ?Physical Activity: Not on file  ?Stress: Not on file  ?Social Connections: Not on file  ?  ? ?Family History: ?The patient's family history includes ADD / ADHD in his sister; Colon cancer in his sister; Diabetes in his sister; Heart attack (age of onset: 33) in his maternal uncle; High blood pressure in his sister; Lung cancer in his father. ? ?ROS:   ?Please see the history of present illness.    ?Snores loudly, has excessive daytime sleepiness.  All other systems reviewed and are negative. ? ?EKGs/Labs/Other Studies Reviewed:   ? ?The following studies were reviewed today: ? ?ECHOCARDIOGRAPHY 2022: ?IMPRESSIONS  ? ? ? 1. Poor acoustic windows limit study.  ? 2. Left ventricular ejection fraction, by estimation, is 65 to 70%. The  ?left ventricle has normal function. The left ventricle has no regional  ?wall motion abnormalities. There is not assessed left ventricular  ?hypertrophy. Left ventricular diastolic  ?parameters are consistent with Grade I diastolic dysfunction (impaired  ?relaxation).  ? 3. Right ventricular systolic function is normal. The right ventricular  ?size is normal.  ? 4. Left atrial size was mildly dilated.  ? 5. The mitral valve is normal in structure. Trivial mitral valve  ?regurgitation.  ? 6. The  aortic valve is normal in structure. Aortic valve regurgitation is  ?not visualized.  ? 7. Pulmonic valve regurgitation not assessed.  ? 8. The inferior vena cava normal size.  ? ?EKG:  EKG normal sinus rhythm with normal appearance ? ?Recent Labs: ?07/17/2021: TSH 4.10 ?11/21/2021: ALT 32; BUN 26; Creat 0.95; Hemoglobin 17.3; Platelets 218; Potassium 3.5; Sodium 138  ?Recent Lipid Panel ?   ?Component Value Date/Time  ?  CHOL 109 07/17/2021 0816  ? TRIG 75 07/17/2021 0816  ? HDL 41 07/17/2021 0816  ? CHOLHDL 2.7 07/17/2021 0816  ? [place] 52 07/17/2021 0816  ? ? ?Physical Exam:   ? ?VS:  BP 128/80   Pulse 68   Ht 6' 3"  (1.905 m)   Wt (!) 343 lb 6.4 oz (155.8 kg)   SpO2 95%   BMI 42.92 kg/m?    ? ?Wt Readings from Last 3 Encounters:  ?12/13/21 (!) 343 lb 6.4 oz (155.8 kg)  ?11/23/21 (!) 341 lb 6.4 oz (154.9 kg)  ?07/20/21 (!) 342 lb 9.6 oz (155.4 kg)  ?  ? ?GEN: Morbidly obese. No acute distress ?HEENT: Normal ?NECK: No JVD. ?LYMPHATICS: No lymphadenopathy ?CARDIAC: No murmur. RRR no gallop, or edema. ?VASCULAR:  Normal Pulses. No bruits. ?RESPIRATORY:  Clear to auscultation without rales, wheezing or rhonchi  ?ABDOMEN: Soft, non-tender, non-distended, No pulsatile mass, ?MUSCULOSKELETAL: No deformity  ?SKIN: Warm and dry ?NEUROLOGIC:  Alert and oriented x 3 ?PSYCHIATRIC:  Normal affect  ? ?ASSESSMENT:   ? ?1. Paroxysmal atrial fibrillation (HCC)   ?2. Snoring   ?3. Type 2 diabetes mellitus without complication, with long-term current use of insulin ([place])   ?4. Chronic anticoagulation   ?5. History of pulmonary embolism   ? ?PLAN:   ? ?In order of problems listed above: ? ?30-day monitor to exclude high atrial fibrillation burden.  We will withhold adding anticoagulation therapy until we document the presence or absence of atrial fibrillation.  If no A-fib, no indication for anticoagulation for what would be presumed to be an acute illness related bout. ?If he has A-fib identified on the 30-day monitor, he will  need to have a sleep study done at some point. ?Consider SGLT2 therapy ?He is not on anticoagulation therapy at this time.  He does take an aspirin daily.  Eliquis has been discontinued and was used purely t

## 2021-12-13 ENCOUNTER — Encounter: Payer: Self-pay | Admitting: Interventional Cardiology

## 2021-12-13 ENCOUNTER — Ambulatory Visit (INDEPENDENT_AMBULATORY_CARE_PROVIDER_SITE_OTHER): Payer: Self-pay | Admitting: Interventional Cardiology

## 2021-12-13 VITALS — BP 128/80 | HR 68 | Ht 75.0 in | Wt 343.4 lb

## 2021-12-13 DIAGNOSIS — E119 Type 2 diabetes mellitus without complications: Secondary | ICD-10-CM

## 2021-12-13 DIAGNOSIS — Z7901 Long term (current) use of anticoagulants: Secondary | ICD-10-CM

## 2021-12-13 DIAGNOSIS — Z86711 Personal history of pulmonary embolism: Secondary | ICD-10-CM

## 2021-12-13 DIAGNOSIS — R0683 Snoring: Secondary | ICD-10-CM

## 2021-12-13 DIAGNOSIS — I480 Paroxysmal atrial fibrillation: Secondary | ICD-10-CM

## 2021-12-13 DIAGNOSIS — Z794 Long term (current) use of insulin: Secondary | ICD-10-CM

## 2021-12-13 NOTE — Patient Instructions (Signed)
Medication Instructions:  ?Your physician recommends that you continue on your current medications as directed. Please refer to the Current Medication list given to you today. ? ?*If you need a refill on your cardiac medications before your next appointment, please call your pharmacy* ? ?Lab Work: ?NONE ? ?Testing/Procedures: ?Your physician has recommended that you wear an event monitor for 30 days. Event monitors are medical devices that record the heart?s electrical activity. Doctors most often [place] these monitors to diagnose arrhythmias. Arrhythmias are problems with the speed or rhythm of the heartbeat. The monitor is a small, portable device. You can wear one while you do your normal daily activities. This is usually used to diagnose what is causing palpitations/syncope (passing out).  ? ?Follow-Up: ?At [facility], you and your health needs are our priority.  As part of our continuing mission to provide you with exceptional heart care, we have created designated Provider Care Teams.  These Care Teams include your primary Cardiologist (physician) and Advanced Practice Providers (APPs -  Physician Assistants and Nurse Practitioners) who all work together to provide you with the care you need, when you need it. ? ?Your next appointment:   ?As needed ? ?The format for your next appointment:   ?In Person ? ?Provider:   ?Lesleigh Noe, MD { ? ? ?Important Information About Sugar ? ? ? ? ?  ?

## 2021-12-21 ENCOUNTER — Ambulatory Visit (INDEPENDENT_AMBULATORY_CARE_PROVIDER_SITE_OTHER): Payer: Self-pay

## 2021-12-21 DIAGNOSIS — I480 Paroxysmal atrial fibrillation: Secondary | ICD-10-CM

## 2022-01-09 ENCOUNTER — Other Ambulatory Visit: Payer: Self-pay | Admitting: Orthopedic Surgery

## 2022-01-09 DIAGNOSIS — Z794 Long term (current) use of insulin: Secondary | ICD-10-CM

## 2022-01-09 MED ORDER — METFORMIN HCL ER 500 MG PO TB24: 500.0000 mg | ORAL_TABLET | Freq: Every day | ORAL | 1 refills | Status: DC

## 2022-01-11 ENCOUNTER — Other Ambulatory Visit: Payer: Self-pay | Admitting: Orthopedic Surgery

## 2022-01-11 DIAGNOSIS — E1169 Type 2 diabetes mellitus with other specified complication: Secondary | ICD-10-CM

## 2022-01-14 ENCOUNTER — Encounter: Payer: Self-pay | Admitting: Orthopedic Surgery

## 2022-01-14 DIAGNOSIS — E1169 Type 2 diabetes mellitus with other specified complication: Secondary | ICD-10-CM

## 2022-01-16 MED ORDER — ATORVASTATIN CALCIUM 20 MG PO TABS: 20.0000 mg | ORAL_TABLET | Freq: Every day | ORAL | 1 refills | Status: DC

## 2022-01-16 NOTE — Telephone Encounter (Signed)
Black pepper and hot peppers are spices that can irritate hemorrhoids. Try to avoid them if you can to prevent hemorrhoid irritation. I will send prescription for Atorvastatin to [facility].

## 2022-02-24 ENCOUNTER — Ambulatory Visit: Payer: Self-pay | Attending: Internal Medicine

## 2022-02-24 DIAGNOSIS — Z23 Encounter for immunization: Secondary | ICD-10-CM

## 2022-02-26 NOTE — Progress Notes (Signed)
   Covid-19 Vaccination Clinic  Name:  Richard Mullins    MRN: [id number] DOB: 1966/02/21  02/26/2022  [name] was observed post Covid-19 immunization for 15 minutes without incident. He was provided with Vaccine Information Sheet and instruction to access the V-Safe system.   [name] was instructed to call 911 with any severe reactions post vaccine: Difficulty breathing  Swelling of face and throat  A fast heartbeat  A bad rash all over body  Dizziness and weakness   Immunizations Administered     Name Date Dose VIS Date Route   Pfizer Covid-19 Vaccine Bivalent Booster 02/24/2022 12:49 PM 0.3 mL 04/19/2021 Intramuscular   Manufacturer: [employer], [employer]   Lot: [id number]   NDC: [phone]

## 2022-03-02 ENCOUNTER — Encounter: Payer: Self-pay | Admitting: Orthopedic Surgery

## 2022-03-06 ENCOUNTER — Other Ambulatory Visit (HOSPITAL_BASED_OUTPATIENT_CLINIC_OR_DEPARTMENT_OTHER): Payer: Self-pay

## 2022-03-06 MED ORDER — PFIZER COVID-19 VAC BIVALENT 30 MCG/0.3ML IM SUSP
INTRAMUSCULAR | 0 refills | Status: DC
  Filled 2022-03-06: qty 0.3, 1d supply, fill #0

## 2022-03-21 LAB — HM DIABETES EYE EXAM

## 2022-03-26 ENCOUNTER — Encounter: Payer: Self-pay | Admitting: Orthopedic Surgery

## 2022-03-27 ENCOUNTER — Other Ambulatory Visit: Payer: Self-pay

## 2022-03-27 DIAGNOSIS — E1129 Type 2 diabetes mellitus with other diabetic kidney complication: Secondary | ICD-10-CM

## 2022-03-28 LAB — HEMOGLOBIN A1C
Hgb A1c MFr Bld: 6.2 % of total Hgb — ABNORMAL HIGH (ref <5.7)
Mean Plasma Glucose: 131 mg/dL
eAG (mmol/L): 7.3 mmol/L

## 2022-03-28 LAB — MICROALBUMIN / CREATININE URINE RATIO
Creatinine, Urine: 182 mg/dL (ref 20–320)
Microalb Creat Ratio: 44 mcg/mg creat — ABNORMAL HIGH (ref <30)
Microalb, Ur: 8 mg/dL

## 2022-03-29 ENCOUNTER — Ambulatory Visit (INDEPENDENT_AMBULATORY_CARE_PROVIDER_SITE_OTHER): Payer: Self-pay | Admitting: Orthopedic Surgery

## 2022-03-29 ENCOUNTER — Encounter: Payer: Self-pay | Admitting: Orthopedic Surgery

## 2022-03-29 VITALS — BP 152/94 | HR 77 | Temp 96.6°F | Ht 75.0 in | Wt 343.2 lb

## 2022-03-29 DIAGNOSIS — E785 Hyperlipidemia, unspecified: Secondary | ICD-10-CM

## 2022-03-29 DIAGNOSIS — R809 Proteinuria, unspecified: Secondary | ICD-10-CM

## 2022-03-29 DIAGNOSIS — E1169 Type 2 diabetes mellitus with other specified complication: Secondary | ICD-10-CM

## 2022-03-29 DIAGNOSIS — E1129 Type 2 diabetes mellitus with other diabetic kidney complication: Secondary | ICD-10-CM

## 2022-03-29 DIAGNOSIS — K644 Residual hemorrhoidal skin tags: Secondary | ICD-10-CM

## 2022-03-29 MED ORDER — LOSARTAN POTASSIUM 50 MG PO TABS: 50.0000 mg | ORAL_TABLET | Freq: Every day | ORAL | 3 refills | Status: DC

## 2022-03-29 NOTE — Progress Notes (Signed)
Careteam: Patient Care Team: Yvonna Alanis, NP as PCP - General (Adult Health Nurse Practitioner) Belva Crome, MD as PCP - Cardiology (Cardiology) Heath Lark, MD as Consulting Physician (Hematology and Oncology)  Seen by: Windell Moulding, AGNP-C  PLACE OF SERVICE:  [place]  Advanced Directive information    Allergies  Allergen Reactions   Bee Pollen     Allergies   Monosodium Glutamate Diarrhea, Hives and Nausea And Vomiting    Chief Complaint  Patient presents with   Medical Management of Chronic Issues    Patient presents today for a 4 month follow-up.   Quality Metric Gaps    Zoster     HPI: Patient is a 56 y.o. male seen today for medical management of chronic conditions.   Lab work discussed.   A1c 6.2 03/27/2022. No hypoglycemias. Remains on asa,metformin, ARB and statin. Urine microalbumin improved, still elevated at 44. Blood pressure not at goal. He agrees to increase losartan to 50 mg daily. Diabetic eye exam recently done.   External hemorrhoids still bothersome. He is using stool softener and hemorrhoid cream with lidocaine during flare. Discussed Tucks and witch Hazel as well. Alcohol in witch hazel causes burning. He does ride his bike a few times a week. He is looking into changing bike seat and purchased different work out Games developer.   He was evaluated by cardiology earlier this year. He was placed on a heart monitor x 30 days. No evidence of atrial fib.   Discussed flu vaccine this fall.   Review of Systems:  Review of Systems  Constitutional: Negative.   HENT: Negative.    Eyes: Negative.   Respiratory:  Negative for cough, shortness of breath and wheezing.   Cardiovascular:  Negative for chest pain and leg swelling.  Gastrointestinal:        Hemorrhoids  Genitourinary: Negative.   Musculoskeletal: Negative.   Skin: Negative.   Neurological: Negative.   Endo/Heme/Allergies:  Negative for polydipsia.  Psychiatric/Behavioral: Negative.       Past Medical History:  Diagnosis Date   Atrial fibrillation ([place]) 95/28/4132   Complication of anesthesia    DURING DENTAL PROCEDURE   Diabetes mellitus without complication (HCC)    Gastroesophageal reflux disease    History of blood clots 09/20/2020   History of COVID-19 2022   PONV (postoperative nausea and vomiting)    Psoriasis    Past Surgical History:  Procedure Laterality Date   ingrown toenails  1995   WISDOM TOOTH EXTRACTION     Social History:   reports that he has never smoked. He has never used smokeless tobacco. He reports current alcohol use. He reports that he does not currently use drugs.  Family History  Problem Relation Age of Onset   Lung cancer Father    Colon cancer Sister    Heart attack Maternal Uncle 84   High blood pressure Sister    ADD / ADHD Sister    Diabetes Sister     Medications: Patient's Medications  New Prescriptions   No medications on file  Previous Medications   ACETAMINOPHEN (TYLENOL) 500 MG TABLET    Take 500 mg by mouth as needed.   ASPIRIN EC 81 MG TABLET    Take 81 mg by mouth daily. Swallow whole. Pt takes in the morning.   ATORVASTATIN (LIPITOR) 20 MG TABLET    Take 1 tablet (20 mg total) by mouth daily.   BLOOD GLUCOSE MONITORING SUPPL (RELION CONFIRM GLUCOSE MONITOR) W/DEVICE  KIT    by Does not apply route as directed.   CASANTHRANOL-DOCUSATE SODIUM (STOOL SOFTENER PLUS PO)    Take by mouth daily.   CLOTRIMAZOLE (LOTRIMIN) 1 % CREAM    Apply 1 application topically as needed (for foot fungus).   COVID-19 MRNA BIVALENT VACCINE, PFIZER, (PFIZER COVID-19 VAC BIVALENT) INJECTION    Inject into the muscle.   LOPERAMIDE (IMODIUM) 2 MG CAPSULE    Take by mouth as needed for diarrhea or loose stools.   LOSARTAN (COZAAR) 25 MG TABLET    Take 1 tablet (25 mg total) by mouth daily.   METFORMIN (GLUCOPHAGE-XR) 500 MG 24 HR TABLET    Take 1 tablet (500 mg total) by mouth daily with breakfast.   METOPROLOL TARTRATE (LOPRESSOR) 100  MG TABLET    Take 1 tablet by mouth twice daily   MULTIPLE VITAMIN (MULTIVITAMIN ADULT PO)    Take 1 tablet by mouth once a week.  Modified Medications   No medications on file  Discontinued Medications   ALOE VERA 99 % GEL    Apply topically as needed.   ISOPROPYL ALCOHOL (ALCOHOL WIPES EX)    Apply topically in the morning and at bedtime.   TRIAMCINOLONE (KENALOG) 0.1 %    Apply topically 3 (three) times daily.    Physical Exam:  Vitals:   03/29/22 1352  BP: (!) 152/94  Pulse: 77  Temp: (!) 96.6 F (35.9 C)  SpO2: 97%  Weight: (!) 343 lb 3.2 oz (155.7 kg)  Height: 6' 3"  (1.905 m)   Body mass index is 42.9 kg/m. Wt Readings from Last 3 Encounters:  03/29/22 (!) 343 lb 3.2 oz (155.7 kg)  12/13/21 (!) 343 lb 6.4 oz (155.8 kg)  11/23/21 (!) 341 lb 6.4 oz (154.9 kg)    Physical Exam Vitals reviewed.  Constitutional:      General: He is not in acute distress.    Appearance: He is obese.  HENT:     Head: Normocephalic.  Eyes:     General:        Right eye: No discharge.        Left eye: No discharge.  Neck:     Thyroid: No thyroid mass, thyromegaly or thyroid tenderness.  Cardiovascular:     Rate and Rhythm: Normal rate and regular rhythm.     Pulses: Normal pulses.     Heart sounds: Normal heart sounds.  Pulmonary:     Effort: Pulmonary effort is normal. No respiratory distress.     Breath sounds: Normal breath sounds. No wheezing.  Abdominal:     General: Bowel sounds are normal. There is no distension.     Palpations: Abdomen is soft.     Tenderness: There is no abdominal tenderness.  Musculoskeletal:     Cervical back: Neck supple.     Right lower leg: No edema.     Left lower leg: No edema.  Lymphadenopathy:     Cervical: No cervical adenopathy.  Skin:    General: Skin is warm and dry.     Capillary Refill: Capillary refill takes less than 2 seconds.  Neurological:     General: No focal deficit present.     Mental Status: He is alert and oriented to  person, place, and time.  Psychiatric:        Mood and Affect: Mood normal.        Behavior: Behavior normal.     Labs reviewed: Basic Metabolic Panel: Recent Labs  04/11/21 0826 07/17/21 0816 11/21/21 0812  NA 140  --  138  K 4.1  --  3.5  CL 104  --  102  CO2 24  --  26  GLUCOSE 117*  --  99  BUN 24  --  26*  CREATININE 0.87  --  0.95  CALCIUM 9.2  --  9.4  TSH  --  4.10  --    Liver Function Tests: Recent Labs    11/21/21 0812  AST 19  ALT 32  BILITOT 0.5  PROT 7.5   No results for input(s): "LIPASE", "AMYLASE" in the last 8760 hours. No results for input(s): "AMMONIA" in the last 8760 hours. CBC: Recent Labs    04/11/21 0826 05/01/21 1117 11/21/21 0812  WBC 10.2 11.8* 10.2  NEUTROABS 5,590 6.4 5,059  HGB 17.2* 16.6 17.3*  HCT 50.6* 47.9 51.0*  MCV 90.5 89.0 90.3  PLT 216 195 218   Lipid Panel: Recent Labs    07/17/21 0816  CHOL 109  HDL 41  LDLCALC 52  TRIG 75  CHOLHDL 2.7   TSH: Recent Labs    07/17/21 0816  TSH 4.10   A1C: Lab Results  Component Value Date   HGBA1C 6.2 (H) 03/27/2022     Assessment/Plan 1. Type 2 diabetes mellitus with microalbuminuria, without long-term current use of insulin (HCC) - A1c 6.2 03/27/2022, was 6.3 - will discuss SLGT2 next encounter - cont asa, ARB, metformin and statin - losartan (COZAAR) 50 MG tablet; Take 1 tablet (50 mg total) by mouth daily.  Dispense: 90 tablet; Refill: 3 - Hemoglobin A1c; Future - BMP with eGFR([facility]); Future  2. Hyperlipidemia associated with type 2 diabetes mellitus ([place]) - LDL 52 06/2021 - cont statin - Lipid Panel; Future  3. Morbid obesity with body mass index (BMI) of 40.0 or higher (HCC) - BMI 42.90 - recommend < 1800 calories daily - recommend 150 minutes exercise weekly  4. External hemorrhoids - intermittent - cont stool softener - avoid straining - ? Bike riding causing flares - he is looking into new bike seat  5. Microalbuminuria due to type 2  diabetes mellitus ([place]) - ratio 44 (08/08), was 53 (04/04) - will increase losartan foe kidney protection - losartan (COZAAR) 50 MG tablet; Take 1 tablet (50 mg total) by mouth daily.  Dispense: 90 tablet; Refill: 3 - Microalbumin/Creatinine Ratio, Urine; Future  Total time: 35 minutes. Greater than 50% of total time spent doing patient education regarding health maintenance, T2DM, HLD, hemorrhoid treatment, and medication management.    Next appt: Visit date not found  [place] [phone]

## 2022-03-29 NOTE — Patient Instructions (Addendum)
Please let me know where to send new losartan prescription- check blood pressure if you feel dizzy  You may take 2 tablets of your current losartan until finished- you are now on 50 mg  Please get flu vaccine before Nov 1st  Yellow mustard for muscle cramps or spasms   Schedule fasting lab work prior to next visit   Vaseline after shower to help with Callus  Try strong nail clippers- triangle shape

## 2022-03-31 ENCOUNTER — Encounter: Payer: Self-pay | Admitting: Orthopedic Surgery

## 2022-04-16 ENCOUNTER — Other Ambulatory Visit: Payer: Self-pay

## 2022-04-16 DIAGNOSIS — E1129 Type 2 diabetes mellitus with other diabetic kidney complication: Secondary | ICD-10-CM

## 2022-04-16 DIAGNOSIS — R809 Proteinuria, unspecified: Secondary | ICD-10-CM

## 2022-04-16 MED ORDER — LOSARTAN POTASSIUM 50 MG PO TABS: 50.0000 mg | ORAL_TABLET | Freq: Every day | ORAL | 3 refills | Status: DC

## 2022-04-18 ENCOUNTER — Other Ambulatory Visit: Payer: Self-pay

## 2022-04-18 DIAGNOSIS — E1129 Type 2 diabetes mellitus with other diabetic kidney complication: Secondary | ICD-10-CM

## 2022-04-18 MED ORDER — LOSARTAN POTASSIUM 50 MG PO TABS: 50.0000 mg | ORAL_TABLET | Freq: Every day | ORAL | 3 refills | Status: DC

## 2022-07-09 ENCOUNTER — Other Ambulatory Visit: Payer: Self-pay | Admitting: Orthopedic Surgery

## 2022-07-09 DIAGNOSIS — E119 Type 2 diabetes mellitus without complications: Secondary | ICD-10-CM

## 2022-08-26 HISTORY — PX: DENTAL SURGERY: SHX609

## 2022-09-07 ENCOUNTER — Other Ambulatory Visit: Payer: Self-pay | Admitting: Orthopedic Surgery

## 2022-09-07 DIAGNOSIS — I4891 Unspecified atrial fibrillation: Secondary | ICD-10-CM

## 2022-09-18 IMAGING — CT CT ANGIO CHEST
2 of 6 series · 18 of 46 positions shown · IV contrast (omnipaque)
Comparison: None.

CLINICAL DATA: Increased heart rate and shortness of breath. Recent
cardioversion. COVID positive

EXAM:
CT ANGIOGRAPHY CHEST WITH CONTRAST
TECHNIQUE: Multidetector CT imaging of the chest was performed using the
standard protocol during bolus administration of intravenous
contrast. Multiplanar CT image reconstructions and MIPs were
obtained to evaluate the vascular anatomy.
CONTRAST:  100mL OMNIPAQUE IOHEXOL 350 MG/ML SOLN

[Series 7: thins · axial · 0.78mm/px · z∈[+1185,+1517]mm · 15 of 520 slices shown]
[im 23/520  lung]
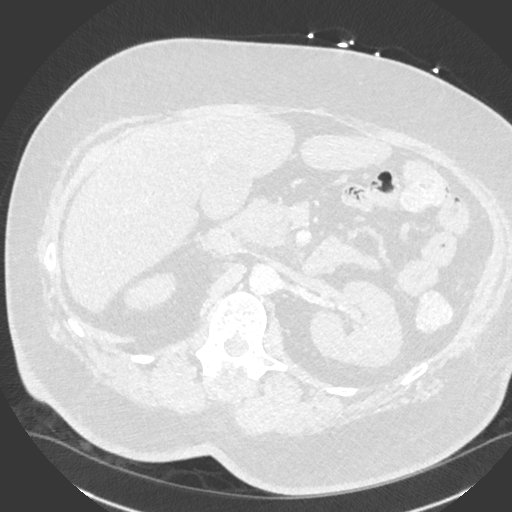
[im 68/520  soft-tissue]
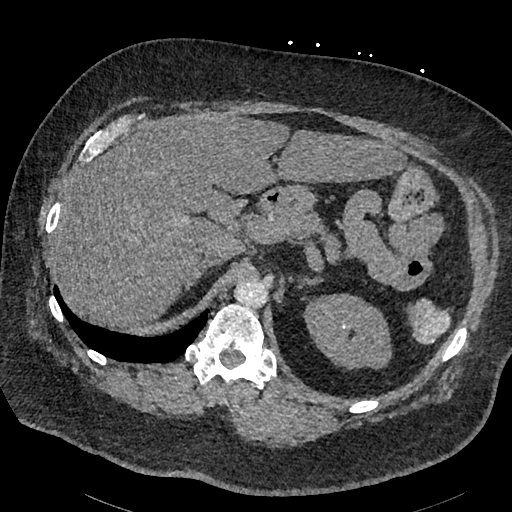
[im 91/520  lung]
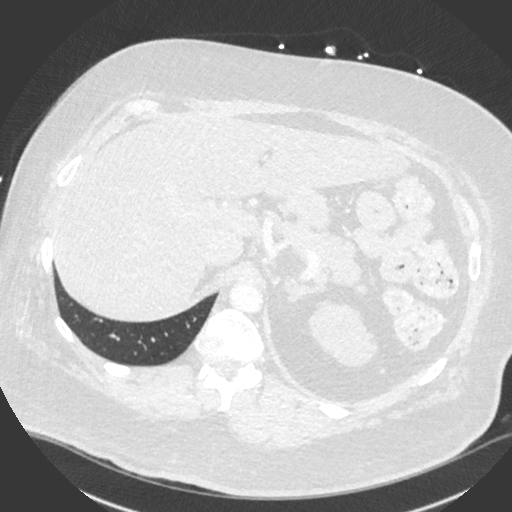
[im 136/520  soft-tissue]
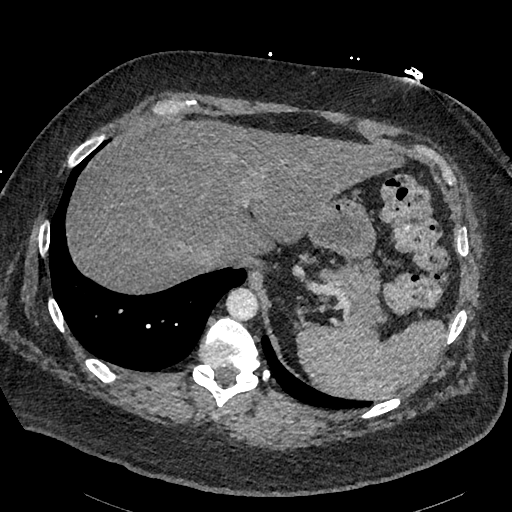
[im 158/520  lung]
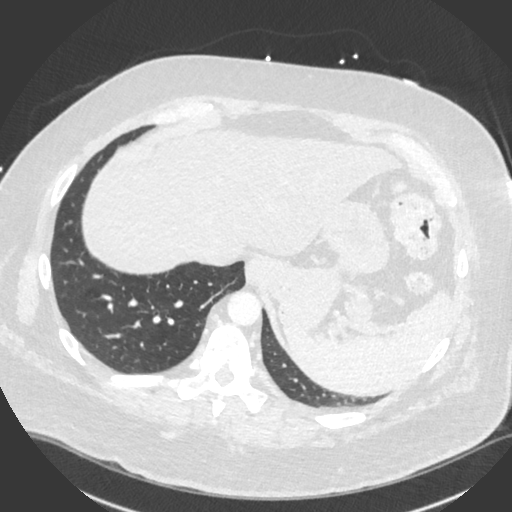
[im 204/520  soft-tissue]
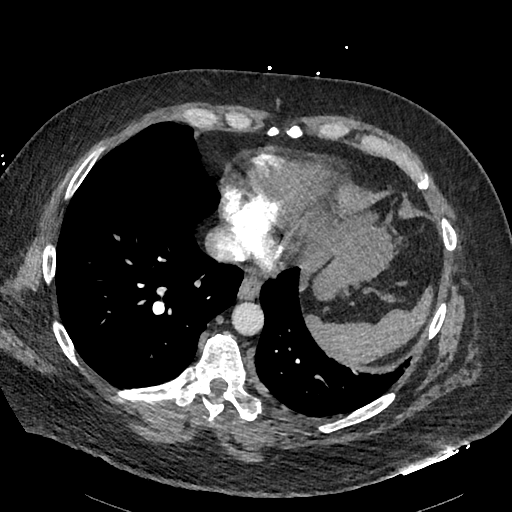
[im 226/520  lung]
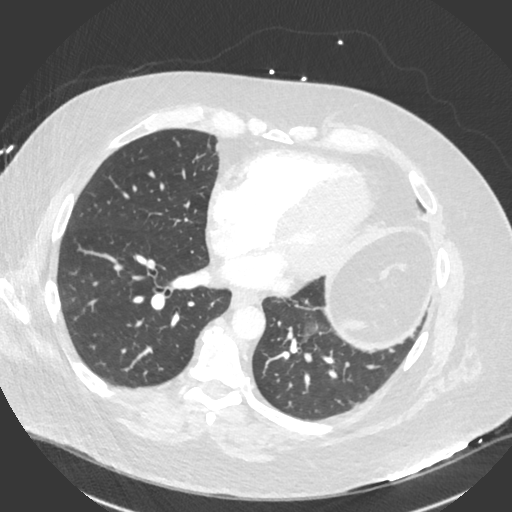
[im 271/520  soft-tissue]
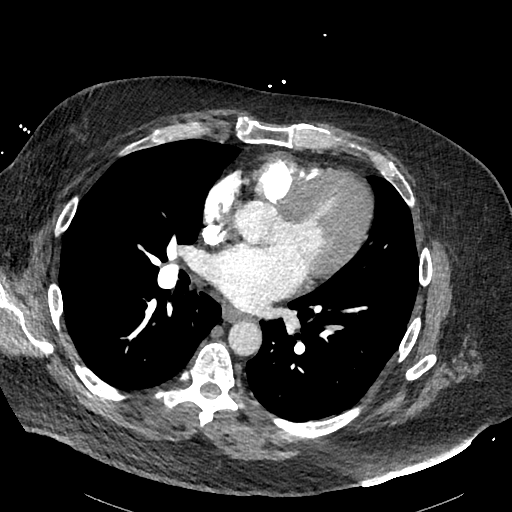
[im 294/520  lung]
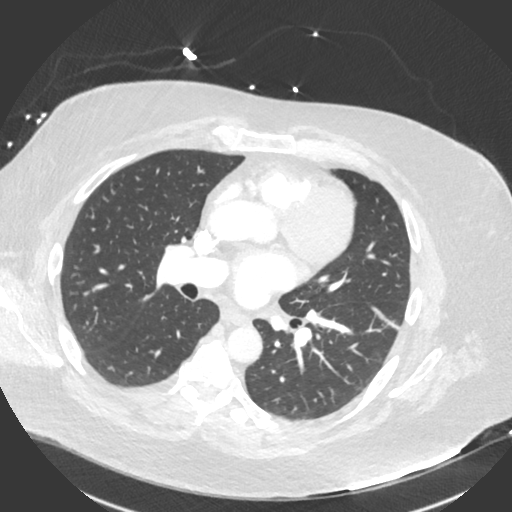
[im 316/520  soft-tissue]
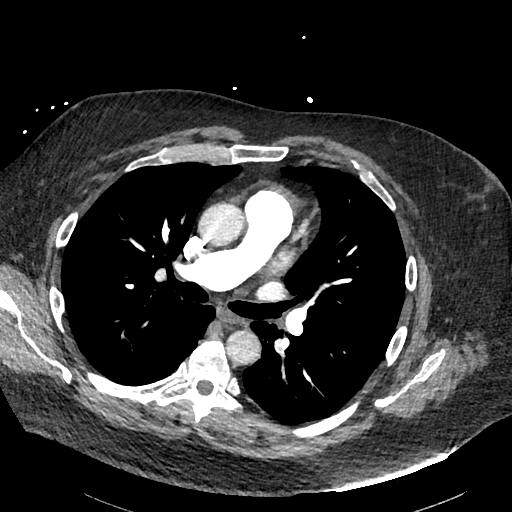
[im 362/520  lung]
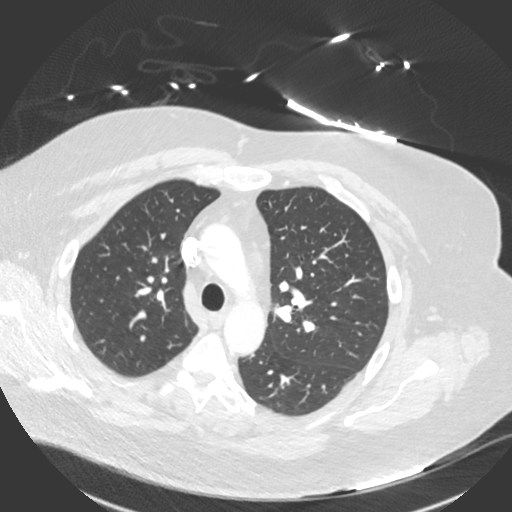
[im 384/520  soft-tissue]
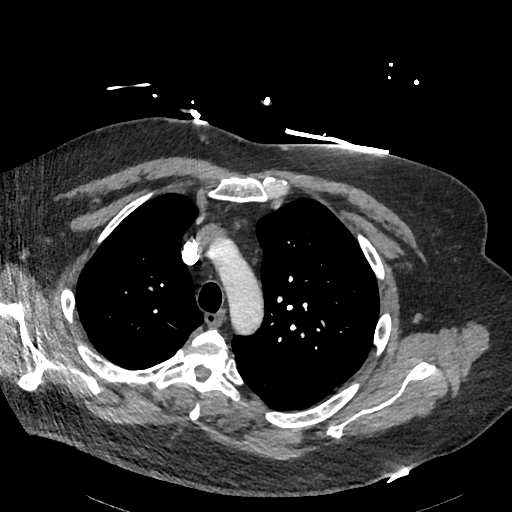
[im 429/520  lung]
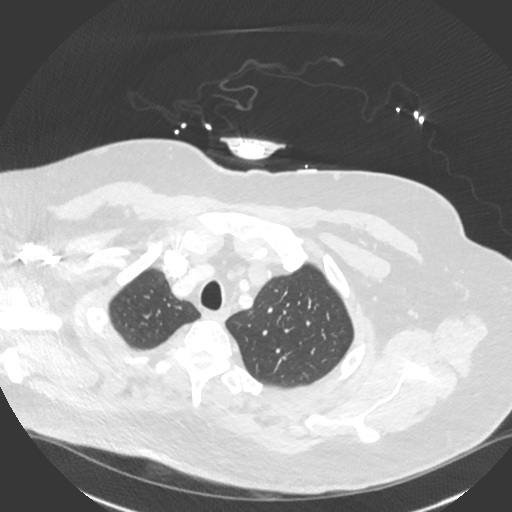
[im 452/520  soft-tissue]
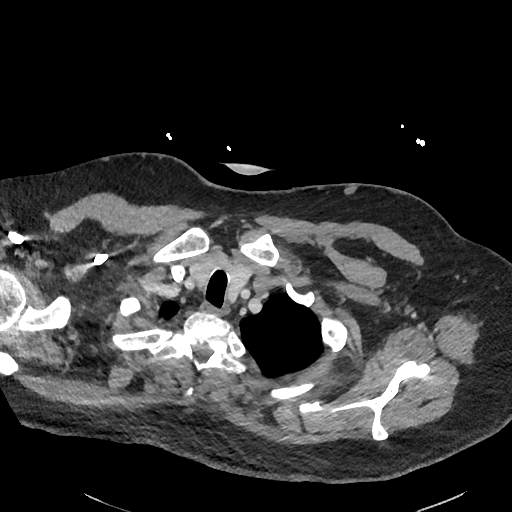
[im 497/520  lung]
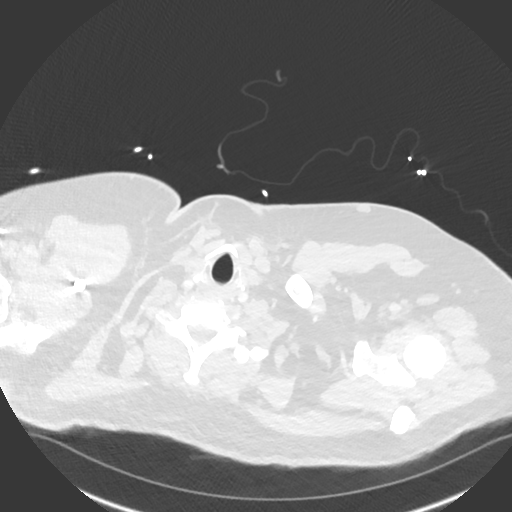

[Series 8: cor · coronal · 0.77mm/px · 3 of 168 slices shown]
[im 42/168  soft-tissue]
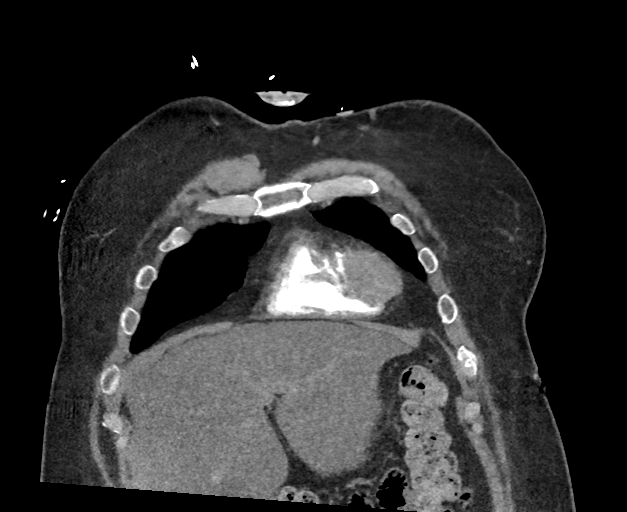
[im 84/168  soft-tissue]
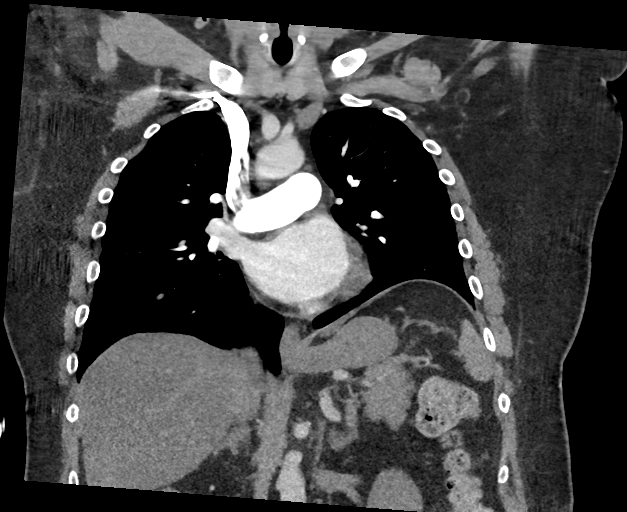
[im 126/168  soft-tissue]
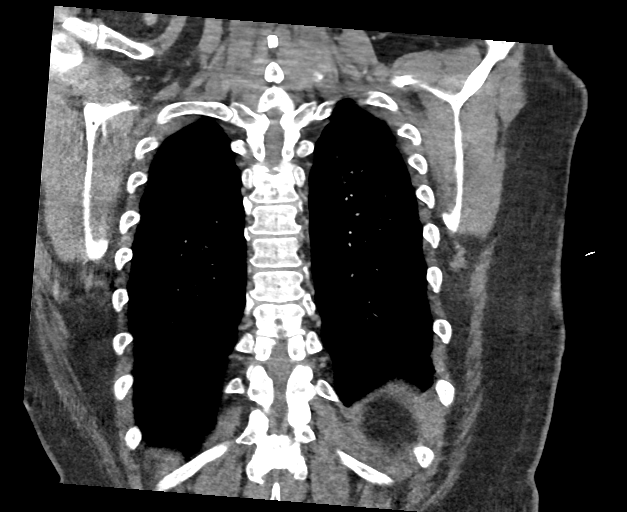

[18 of 46 positions shown; findings below may reference images not displayed]

FINDINGS: Cardiovascular: Branching pulmonary artery filling defects within
anterior basal segment right lower lobe and branching in the right
upper lobe pulmonary artery. The clot burden is smaller than would
be expected to cause right heart dilatation. No cardiomegaly or
pericardial effusion. Unremarkable aorta

Mediastinum/Nodes: Negative for adenopathy or mass.

Lungs/Pleura: Central airways are clear. No failure or lung infarct.
Mild atelectasis in the left lower lung.

Upper Abdomen: Hepatic steatosis. Punctate left upper pole calculus.

Musculoskeletal: No acute or aggressive finding.

Review of the MIP images confirms the above findings.

Critical Value/emergent results were called by telephone at the time
of interpretation on 09/22/2020 at [DATE] to provider NOMASIBULELE
MARINO GALIJOT , who verbally acknowledged these results.
IMPRESSION: 1. Positive for lobar and segmental acute pulmonary emboli on the
right.
2. Hepatic steatosis.

## 2022-09-18 IMAGING — DX DG CHEST 1V PORT
1 series · 1 of 1 positions shown · non-contrast
Comparison: 09/20/2020

CLINICAL DATA: Atrial fibrillation and shortness of breath

EXAM:
PORTABLE CHEST 1 VIEW

[chest ap]
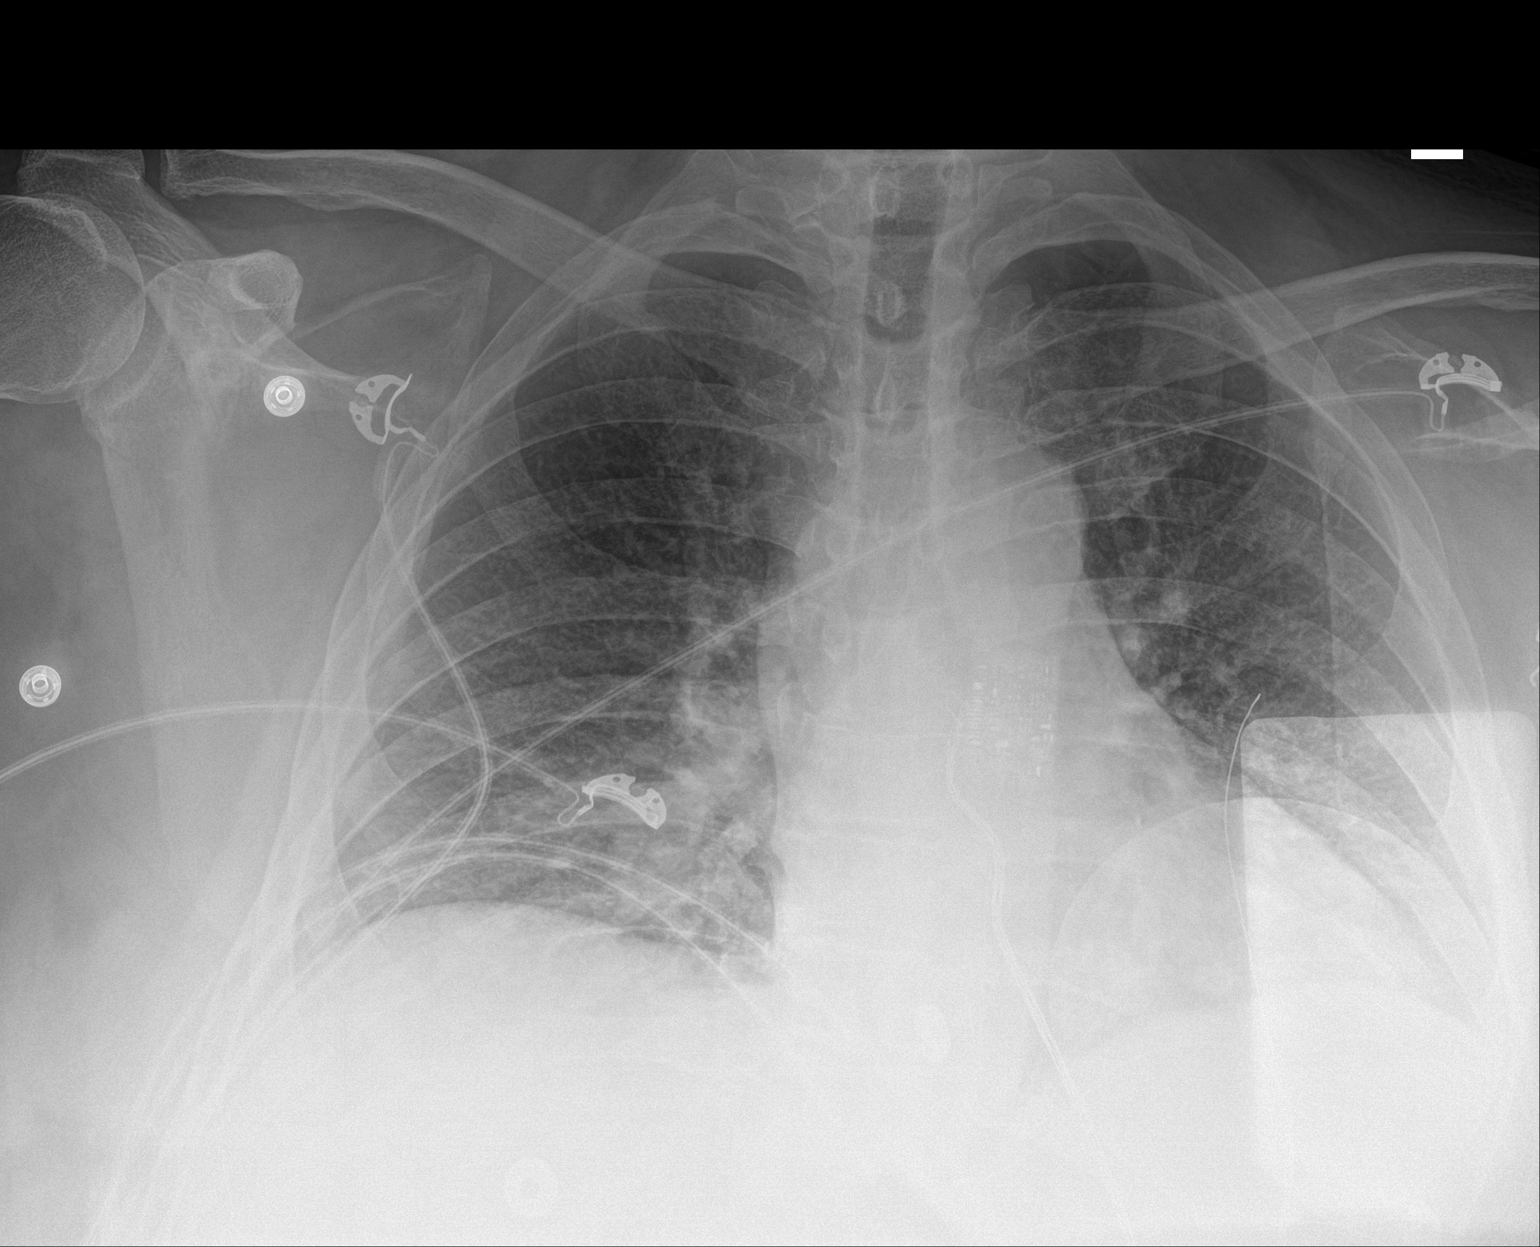

[1 of 1 positions shown; findings below may reference images not displayed]

FINDINGS: Cardiac shadow is within normal limits. The lungs are well aerated
bilaterally. No focal infiltrate or sizable effusion is seen. No
bony abnormality is noted.
IMPRESSION: No acute abnormality noted.

## 2022-10-02 ENCOUNTER — Other Ambulatory Visit: Payer: Self-pay

## 2022-10-02 DIAGNOSIS — E1129 Type 2 diabetes mellitus with other diabetic kidney complication: Secondary | ICD-10-CM

## 2022-10-02 DIAGNOSIS — E1169 Type 2 diabetes mellitus with other specified complication: Secondary | ICD-10-CM

## 2022-10-03 ENCOUNTER — Other Ambulatory Visit: Payer: Self-pay | Admitting: Orthopedic Surgery

## 2022-10-03 ENCOUNTER — Encounter: Payer: Self-pay | Admitting: Orthopedic Surgery

## 2022-10-03 DIAGNOSIS — E119 Type 2 diabetes mellitus without complications: Secondary | ICD-10-CM

## 2022-10-03 LAB — LIPID PANEL
Cholesterol: 107 mg/dL (ref <200)
HDL: 40 mg/dL (ref >=40)
LDL Cholesterol (Calc): 50 mg/dL (calc)
Non-HDL Cholesterol (Calc): 67 mg/dL (calc) (ref <130)
Total CHOL/HDL Ratio: 2.7 (calc) (ref <5.0)
Triglycerides: 86 mg/dL (ref <150)

## 2022-10-03 LAB — MICROALBUMIN / CREATININE URINE RATIO
Creatinine, Urine: 43 mg/dL (ref 20–320)
Microalb Creat Ratio: 51 mcg/mg creat — ABNORMAL HIGH (ref <30)
Microalb, Ur: 2.2 mg/dL

## 2022-10-03 LAB — BASIC METABOLIC PANEL WITH GFR
BUN: 17 mg/dL (ref 7–25)
CO2: 23 mmol/L (ref 20–32)
Calcium: 9.3 mg/dL (ref 8.6–10.3)
Chloride: 101 mmol/L (ref 98–110)
Creat: 0.95 mg/dL (ref 0.70–1.30)
Glucose, Bld: 127 mg/dL — ABNORMAL HIGH (ref 65–99)
Potassium: 3.6 mmol/L (ref 3.5–5.3)
Sodium: 137 mmol/L (ref 135–146)
eGFR: 94 mL/min/{1.73_m2} (ref >=60)

## 2022-10-03 LAB — HEMOGLOBIN A1C
Hgb A1c MFr Bld: 7 % of total Hgb — ABNORMAL HIGH (ref <5.7)
Mean Plasma Glucose: 154 mg/dL
eAG (mmol/L): 8.5 mmol/L

## 2022-10-03 MED ORDER — METFORMIN HCL ER 500 MG PO TB24: 500.0000 mg | ORAL_TABLET | Freq: Two times a day (BID) | ORAL | 3 refills | Status: DC

## 2022-10-04 ENCOUNTER — Ambulatory Visit: Payer: Self-pay | Admitting: Orthopedic Surgery

## 2022-10-11 ENCOUNTER — Encounter: Payer: Self-pay | Admitting: Orthopedic Surgery

## 2022-10-11 ENCOUNTER — Ambulatory Visit (INDEPENDENT_AMBULATORY_CARE_PROVIDER_SITE_OTHER): Payer: Self-pay | Admitting: Orthopedic Surgery

## 2022-10-11 VITALS — BP 128/70 | HR 78 | Temp 97.2°F | Resp 18 | Ht 75.0 in | Wt 346.8 lb

## 2022-10-11 DIAGNOSIS — E785 Hyperlipidemia, unspecified: Secondary | ICD-10-CM

## 2022-10-11 DIAGNOSIS — E1129 Type 2 diabetes mellitus with other diabetic kidney complication: Secondary | ICD-10-CM

## 2022-10-11 DIAGNOSIS — I4891 Unspecified atrial fibrillation: Secondary | ICD-10-CM

## 2022-10-11 DIAGNOSIS — R809 Proteinuria, unspecified: Secondary | ICD-10-CM

## 2022-10-11 DIAGNOSIS — E1169 Type 2 diabetes mellitus with other specified complication: Secondary | ICD-10-CM

## 2022-10-11 NOTE — Patient Instructions (Addendum)
Need colonoscopy   Call [facility] about test prices

## 2022-10-11 NOTE — Progress Notes (Signed)
Careteam: Patient Care Team: Yvonna Alanis, NP as PCP - General (Adult Health Nurse Practitioner) Belva Crome, MD (Inactive) as PCP - Cardiology (Cardiology) Heath Lark, MD as Consulting Physician (Hematology and Oncology)  Seen by: Windell Moulding, AGNP-C  PLACE OF SERVICE:  [place]  Advanced Directive information    Allergies  Allergen Reactions   Bee Pollen     Allergies   Monosodium Glutamate Diarrhea, Hives and Nausea And Vomiting    Chief Complaint  Patient presents with   Medical Management of Chronic Issues    6 month follow up.   Health Maintenance    Discuss the need for Hepatitis C Screening, Colonoscopy, and Foot exam.   Immunizations    Discuss the need for DTAP vaccine, Shingrix vaccine, Influenza vaccine, and Covid Booster. NCIR Verified.      HPI: Patient is a 57 y.o. male seen today for medical management of chronic conditions.   Labs reviewed with patient.   Metformin recently increased to BID. He is taking medications with meals. He is checking sugars daily. This morning he was 106. Last diabetic exam was 03/2022. No recent hypoglycemias. Remains on losartan for microalbuminuria. Diabetic foot exam done today.   Weight has increased within past year. 11/2021 weight averaged 333 lbs> now 346 lbs today. Discussed weight loss interventions. He is very active and rides his bike daily a few miles. He also works at [employer]. He is interested in [facility] program> referral made.   HR< 100, controlled with metoprolol. Off Eliquis.   Does not want colonoscopy at this time. Not interested in covid, shingles or tdap vaccines either. He is looking up insurance options. At this time he is private pay.  Review of Systems:  Review of Systems  Constitutional:  Negative for chills and fever.  HENT:  Negative for congestion and sore throat.   Eyes:  Negative for blurred vision.  Respiratory:  Negative for cough, shortness of breath and  wheezing.   Cardiovascular:  Negative for chest pain and leg swelling.  Gastrointestinal:  Negative for abdominal pain, blood in stool and heartburn.       Hemorrhoids  Genitourinary:  Negative for dysuria.  Musculoskeletal:  Negative for falls and joint pain.  Skin:  Negative for rash.  Neurological:  Negative for dizziness and headaches.  Psychiatric/Behavioral:  Negative for depression. The patient is not nervous/anxious.     Past Medical History:  Diagnosis Date   Atrial fibrillation ([place]) 123XX123   Complication of anesthesia    DURING DENTAL PROCEDURE   Diabetes mellitus without complication (HCC)    Gastroesophageal reflux disease    History of blood clots 09/20/2020   History of COVID-19 2022   PONV (postoperative nausea and vomiting)    Psoriasis    Past Surgical History:  Procedure Laterality Date   ingrown toenails  1995   WISDOM TOOTH EXTRACTION     Social History:   reports that he has never smoked. He has never used smokeless tobacco. He reports current alcohol use. He reports that he does not currently use drugs.  Family History  Problem Relation Age of Onset   Lung cancer Father    Colon cancer Sister    Heart attack Maternal Uncle 37   High blood pressure Sister    ADD / ADHD Sister    Diabetes Sister     Medications: Patient's Medications  New Prescriptions   No medications on file  Previous Medications  ACETAMINOPHEN (TYLENOL) 500 MG TABLET    Take 500 mg by mouth as needed.   ASPIRIN EC 81 MG TABLET    Take 81 mg by mouth daily. Swallow whole. Pt takes in the morning.   ATORVASTATIN (LIPITOR) 20 MG TABLET    Take 1 tablet (20 mg total) by mouth daily.   BLOOD GLUCOSE MONITORING SUPPL (RELION CONFIRM GLUCOSE MONITOR) W/DEVICE KIT    by Does not apply route as directed.   CASANTHRANOL-DOCUSATE SODIUM (STOOL SOFTENER PLUS PO)    Take by mouth daily.   CLOTRIMAZOLE (LOTRIMIN) 1 % CREAM    Apply 1 application topically as needed (for foot fungus).    COVID-19 MRNA BIVALENT VACCINE, PFIZER, (PFIZER COVID-19 VAC BIVALENT) INJECTION    Inject into the muscle.   LOPERAMIDE (IMODIUM) 2 MG CAPSULE    Take by mouth as needed for diarrhea or loose stools.   LOSARTAN (COZAAR) 50 MG TABLET    Take 1 tablet (50 mg total) by mouth daily.   METFORMIN (GLUCOPHAGE-XR) 500 MG 24 HR TABLET    Take 1 tablet (500 mg total) by mouth 2 (two) times daily with a meal.   METOPROLOL TARTRATE (LOPRESSOR) 100 MG TABLET    Take 1 tablet by mouth twice daily   MULTIPLE VITAMIN (MULTIVITAMIN ADULT PO)    Take 1 tablet by mouth once a week.  Modified Medications   No medications on file  Discontinued Medications   No medications on file    Physical Exam:  There were no vitals filed for this visit. There is no height or weight on file to calculate BMI. Wt Readings from Last 3 Encounters:  03/29/22 (!) 343 lb 3.2 oz (155.7 kg)  12/13/21 (!) 343 lb 6.4 oz (155.8 kg)  11/23/21 (!) 341 lb 6.4 oz (154.9 kg)    Physical Exam Vitals reviewed.  Constitutional:      General: He is not in acute distress.    Appearance: He is obese.  HENT:     Head: Normocephalic.  Eyes:     General:        Right eye: No discharge.        Left eye: No discharge.  Cardiovascular:     Rate and Rhythm: Normal rate and regular rhythm.     Pulses: Normal pulses.          Dorsalis pedis pulses are 2+ on the right side and 2+ on the left side.       Posterior tibial pulses are 2+ on the right side and 2+ on the left side.     Heart sounds: Normal heart sounds.  Pulmonary:     Effort: Pulmonary effort is normal. No respiratory distress.     Breath sounds: Normal breath sounds. No wheezing.  Abdominal:     General: Bowel sounds are normal. There is no distension.     Palpations: Abdomen is soft.     Tenderness: There is no abdominal tenderness.  Musculoskeletal:     Cervical back: Neck supple.     Right lower leg: No edema.     Left lower leg: No edema.     Right foot: Normal  range of motion.     Left foot: Normal range of motion.  Feet:     Right foot:     Protective Sensation: 10 sites tested.  10 sites sensed.     Skin integrity: Skin integrity normal.     Toenail Condition: Fungal disease present.  Left foot:     Protective Sensation: 10 sites tested.  10 sites sensed.     Skin integrity: Skin integrity normal.     Toenail Condition: Left toenails are abnormally thick. Fungal disease present. Skin:    General: Skin is warm and dry.     Capillary Refill: Capillary refill takes less than 2 seconds.  Neurological:     General: No focal deficit present.     Mental Status: He is alert and oriented to person, place, and time.  Psychiatric:        Mood and Affect: Mood normal.        Behavior: Behavior normal.     Labs reviewed: Basic Metabolic Panel: Recent Labs    11/21/21 0812 10/02/22 0814  NA 138 137  K 3.5 3.6  CL 102 101  CO2 26 23  GLUCOSE 99 127*  BUN 26* 17  CREATININE 0.95 0.95  CALCIUM 9.4 9.3   Liver Function Tests: Recent Labs    11/21/21 0812  AST 19  ALT 32  BILITOT 0.5  PROT 7.5   No results for input(s): "LIPASE", "AMYLASE" in the last 8760 hours. No results for input(s): "AMMONIA" in the last 8760 hours. CBC: Recent Labs    11/21/21 0812  WBC 10.2  NEUTROABS 5,059  HGB 17.3*  HCT 51.0*  MCV 90.3  PLT 218   Lipid Panel: Recent Labs    10/02/22 0814  CHOL 107  HDL 40  LDLCALC 50  TRIG 86  CHOLHDL 2.7   TSH: No results for input(s): "TSH" in the last 8760 hours. A1C: Lab Results  Component Value Date   HGBA1C 7.0 (H) 10/02/2022     Assessment/Plan 1. Type 2 diabetes mellitus with microalbuminuria, without long-term current use of insulin (HCC) - A1c 7.0 (02/24)> was 6.2 (03/2022)> was 6.3 (11/2021) - no hypoglycemias - diabetic eye exam 03/2022 - foot exam performed today - metformin recently increased  - discussed weight loss  - CBC with Differential/Platelet; Future - Complete  Metabolic Panel with eGFR; Future - Microalbumin/Creatinine Ratio, Urine; Future  2. Hyperlipidemia associated with type 2 diabetes mellitus (HCC) - Total 107, LDL 50 09/2022 - cont statin - CBC with Differential/Platelet; Future - Complete Metabolic Panel with eGFR; Future - Lipid Panel; Future  3. Microalbuminuria due to type 2 diabetes mellitus (HCC) - urine microalbumin ratio 51 (09/2022)> was 44 (03/2023) - cont losartan for kidney protection  4. Morbid obesity with body mass index (BMI) of 40.0 or higher (HCC) - BMI 43.53 - advised to limit calories < 2000/day - limit portion sizes - Amb Ref to Medical Weight Management  5. Atrial fibrillation, unspecified type (HCC) - HR< 100 on metoprolol - off Eliquis   Future labs/tests: A1c, cbc/diff, cmp, lipid panel, urine microalbumin, discuss colonoscopy  Total time: 34 minutes. Greater than 50% of total time spent doing patient education regarding health maintenance, T2DM, obesity, microalbuminuria including symptom/medication management.    Next appt: Visit date not found  [place], [facility] [phone]

## 2022-12-05 ENCOUNTER — Other Ambulatory Visit: Payer: Self-pay | Admitting: Orthopedic Surgery

## 2022-12-05 DIAGNOSIS — I4891 Unspecified atrial fibrillation: Secondary | ICD-10-CM

## 2023-01-29 ENCOUNTER — Encounter: Payer: Self-pay | Admitting: Orthopedic Surgery

## 2023-02-12 ENCOUNTER — Other Ambulatory Visit: Payer: Self-pay

## 2023-02-12 DIAGNOSIS — E1129 Type 2 diabetes mellitus with other diabetic kidney complication: Secondary | ICD-10-CM

## 2023-02-12 DIAGNOSIS — E1169 Type 2 diabetes mellitus with other specified complication: Secondary | ICD-10-CM

## 2023-02-12 DIAGNOSIS — E119 Type 2 diabetes mellitus without complications: Secondary | ICD-10-CM

## 2023-02-12 LAB — CBC WITH DIFFERENTIAL/PLATELET
Absolute Monocytes: 989 cells/uL — ABNORMAL HIGH (ref 200–950)
Eosinophils Relative: 1.9 %
MCH: 30.9 pg (ref 27.0–33.0)
MPV: 9.8 fL (ref 7.5–12.5)
RDW: 13 % (ref 11.0–15.0)

## 2023-02-13 LAB — COMPLETE METABOLIC PANEL WITH GFR
AG Ratio: 1.3 (calc) (ref 1.0–2.5)
ALT: 36 U/L (ref 9–46)
AST: 23 U/L (ref 10–35)
Albumin: 4.3 g/dL (ref 3.6–5.1)
Alkaline phosphatase (APISO): 47 U/L (ref 35–144)
BUN: 19 mg/dL (ref 7–25)
CO2: 24 mmol/L (ref 20–32)
Calcium: 9.4 mg/dL (ref 8.6–10.3)
Chloride: 102 mmol/L (ref 98–110)
Creat: 0.82 mg/dL (ref 0.70–1.30)
Globulin: 3.3 g/dL (calc) (ref 1.9–3.7)
Glucose, Bld: 112 mg/dL — ABNORMAL HIGH (ref 65–99)
Potassium: 3.5 mmol/L (ref 3.5–5.3)
Sodium: 138 mmol/L (ref 135–146)
Total Bilirubin: 0.8 mg/dL (ref 0.2–1.2)
Total Protein: 7.6 g/dL (ref 6.1–8.1)
eGFR: 103 mL/min/{1.73_m2} (ref >=60)

## 2023-02-13 LAB — MICROALBUMIN / CREATININE URINE RATIO
Creatinine, Urine: 51 mg/dL (ref 20–320)
Microalb Creat Ratio: 39 mg/g creat — ABNORMAL HIGH (ref <30)
Microalb, Ur: 2 mg/dL

## 2023-02-13 LAB — CBC WITH DIFFERENTIAL/PLATELET
Basophils Absolute: 41 cells/uL (ref 0–200)
Basophils Relative: 0.4 %
Eosinophils Absolute: 196 cells/uL (ref 15–500)
HCT: 50 % (ref 38.5–50.0)
Hemoglobin: 16.9 g/dL (ref 13.2–17.1)
Lymphs Abs: 3842 cells/uL (ref 850–3900)
MCHC: 33.8 g/dL (ref 32.0–36.0)
MCV: 91.4 fL (ref 80.0–100.0)
Monocytes Relative: 9.6 %
Neutro Abs: 5232 cells/uL (ref 1500–7800)
Neutrophils Relative %: 50.8 %
Platelets: 195 10*3/uL (ref 140–400)
RBC: 5.47 10*6/uL (ref 4.20–5.80)
Total Lymphocyte: 37.3 %
WBC: 10.3 10*3/uL (ref 3.8–10.8)

## 2023-02-13 LAB — HEMOGLOBIN A1C
Hgb A1c MFr Bld: 6.7 % of total Hgb — ABNORMAL HIGH (ref <5.7)
Mean Plasma Glucose: 146 mg/dL
eAG (mmol/L): 8.1 mmol/L

## 2023-02-13 LAB — LIPID PANEL
Cholesterol: 119 mg/dL (ref <200)
HDL: 45 mg/dL (ref >=40)
LDL Cholesterol (Calc): 58 mg/dL (calc)
Non-HDL Cholesterol (Calc): 74 mg/dL (calc) (ref <130)
Total CHOL/HDL Ratio: 2.6 (calc) (ref <5.0)
Triglycerides: 80 mg/dL (ref <150)

## 2023-02-14 ENCOUNTER — Encounter: Payer: Self-pay | Admitting: Orthopedic Surgery

## 2023-02-14 ENCOUNTER — Ambulatory Visit (INDEPENDENT_AMBULATORY_CARE_PROVIDER_SITE_OTHER): Payer: Self-pay | Admitting: Orthopedic Surgery

## 2023-02-14 VITALS — BP 128/82 | HR 62 | Temp 97.6°F | Resp 16 | Ht 75.0 in | Wt 336.2 lb

## 2023-02-14 DIAGNOSIS — E785 Hyperlipidemia, unspecified: Secondary | ICD-10-CM

## 2023-02-14 DIAGNOSIS — I480 Paroxysmal atrial fibrillation: Secondary | ICD-10-CM

## 2023-02-14 DIAGNOSIS — E1129 Type 2 diabetes mellitus with other diabetic kidney complication: Secondary | ICD-10-CM

## 2023-02-14 DIAGNOSIS — E1169 Type 2 diabetes mellitus with other specified complication: Secondary | ICD-10-CM

## 2023-02-14 DIAGNOSIS — R809 Proteinuria, unspecified: Secondary | ICD-10-CM

## 2023-02-14 MED ORDER — METOPROLOL TARTRATE 100 MG PO TABS
100.0000 mg | ORAL_TABLET | Freq: Two times a day (BID) | ORAL | 1 refills | Status: DC
Start: 2023-02-14 — End: 2023-08-28

## 2023-02-14 MED ORDER — METFORMIN HCL ER 500 MG PO TB24
500.0000 mg | ORAL_TABLET | Freq: Two times a day (BID) | ORAL | 2 refills | Status: DC
Start: 2023-02-14 — End: 2023-08-08

## 2023-02-14 MED ORDER — LOSARTAN POTASSIUM 50 MG PO TABS
50.0000 mg | ORAL_TABLET | Freq: Every day | ORAL | 2 refills | Status: DC
Start: 2023-02-14 — End: 2023-12-31

## 2023-02-14 MED ORDER — ATORVASTATIN CALCIUM 20 MG PO TABS: 20.0000 mg | ORAL_TABLET | Freq: Every day | ORAL | 2 refills | Status: DC

## 2023-02-14 NOTE — Patient Instructions (Addendum)
Recommend the book " The Obesity Code"   Doing great with exercise  Encourage weight loss efforts> either counting calories, limiting portion size, reducing natural sugars   Goal> lose 20 lbs by next visit!!!> you got this!

## 2023-02-14 NOTE — Progress Notes (Signed)
Careteam: Patient Care Team: Octavia Heir, NP as PCP - General (Adult Health Nurse Practitioner) Lyn Records, MD (Inactive) as PCP - Cardiology (Cardiology) Artis Delay, MD as Consulting Physician (Hematology and Oncology)  Seen by: Hazle Nordmann, AGNP-C  PLACE OF SERVICE:  [facility]  Advanced Directive information Does Patient Have a Medical Advance Directive?: No  Allergies  Allergen Reactions   Bee Pollen     Allergies   Monosodium Glutamate Diarrhea, Hives and Nausea And Vomiting    Chief Complaint  Patient presents with   Medical Management of Chronic Issues    4 month follow up. Patient has no other questions or concerns      HPI: Patient is a 57 y.o. Mullins seen today for medical management of chronic conditions.   A1c 6.7. Taking metformin as prescribed. He is checking sugars at home, averaging around 120. No hypoglycemia. Urine microalbumin improved, lisinopril increased to 50 mg last year.   BMI 42.02. Uses bike for transportation. Very active. Discussed diet in associated with diabetes. Discussed reducing calories and portion sizes.   Blood pressure stable today.   Review of Systems:  Review of Systems  Constitutional: Negative.   HENT: Negative.    Eyes: Negative.   Respiratory: Negative.    Cardiovascular: Negative.   Gastrointestinal: Negative.   Genitourinary: Negative.   Musculoskeletal: Negative.   Skin: Negative.   Neurological: Negative.   Endo/Heme/Allergies: Negative.   Psychiatric/Behavioral: Negative.      Past Medical History:  Diagnosis Date   Atrial fibrillation (HCC) 09/20/2020   Complication of anesthesia    DURING DENTAL PROCEDURE   Diabetes mellitus without complication (HCC)    Gastroesophageal reflux disease    History of blood clots 09/20/2020   History of COVID-19 2022   PONV (postoperative nausea and vomiting)    Psoriasis    Past Surgical History:  Procedure Laterality Date   DENTAL SURGERY  08/26/2022   Fill In    ingrown toenails  1995   WISDOM TOOTH EXTRACTION     Social History:   reports that he has never smoked. He has never used smokeless tobacco. He reports current alcohol use. He reports that he does not currently use drugs.  Family History  Problem Relation Age of Onset   Lung cancer Father    Colon cancer Sister    Heart attack Maternal Uncle 5   High blood pressure Sister    ADD / ADHD Sister    Diabetes Sister     Medications: Patient's Medications  New Prescriptions   No medications on file  Previous Medications   ACETAMINOPHEN (TYLENOL) 500 MG TABLET    Take 500 mg by mouth as needed.   ASPIRIN EC 81 MG TABLET    Take 81 mg by mouth daily. Swallow whole. Pt takes in the morning.   ATORVASTATIN (LIPITOR) 20 MG TABLET    Take 1 tablet (20 mg total) by mouth daily.   BLOOD GLUCOSE MONITORING SUPPL (RELION CONFIRM GLUCOSE MONITOR) W/DEVICE KIT    by Does not apply route as directed.   CASANTHRANOL-DOCUSATE SODIUM (STOOL SOFTENER PLUS PO)    Take by mouth daily.   CLOTRIMAZOLE (LOTRIMIN) 1 % CREAM    Apply 1 application topically as needed (for foot fungus).   IBUPROFEN PO    Take 1 tablet by mouth as needed.   LOPERAMIDE (IMODIUM) 2 MG CAPSULE    Take by mouth as needed for diarrhea or loose stools.   LOSARTAN (  COZAAR) 50 MG TABLET    Take 1 tablet (50 mg total) by mouth daily.   METFORMIN (GLUCOPHAGE-XR) 500 MG 24 HR TABLET    Take 1 tablet (500 mg total) by mouth 2 (two) times daily with a meal.   METOPROLOL TARTRATE (LOPRESSOR) 100 MG TABLET    Take 1 tablet by mouth twice daily   MULTIPLE VITAMIN (MULTIVITAMIN ADULT PO)    Take 1 tablet by mouth once a week.  Modified Medications   No medications on file  Discontinued Medications   No medications on file    Physical Exam:  Vitals:   02/14/23 1050  BP: 128/82  Pulse: 62  Resp: 16  Temp: 97.6 F (36.4 C)  TempSrc: Temporal  SpO2: 93%  Weight: (!) 336 lb 3.2 oz (152.5 kg)  Height: 6\' 3"  (1.905 m)   Body mass  index is 42.02 kg/m. Wt Readings from Last 3 Encounters:  02/14/23 (!) 336 lb 3.2 oz (152.5 kg)  10/11/22 (!) 346 lb 12.8 oz (157.3 kg)  03/29/22 (!) 343 lb 3.2 oz (155.7 kg)    Physical Exam Vitals reviewed.  Constitutional:      Appearance: He is obese.  HENT:     Head: Normocephalic.  Eyes:     General:        Right eye: No discharge.        Left eye: No discharge.  Cardiovascular:     Rate and Rhythm: Normal rate and regular rhythm.     Pulses: Normal pulses.     Heart sounds: Normal heart sounds.  Pulmonary:     Effort: Pulmonary effort is normal.     Breath sounds: Normal breath sounds.  Abdominal:     General: Bowel sounds are normal.     Palpations: Abdomen is soft.  Musculoskeletal:     Cervical back: Neck supple.     Right lower leg: No edema.     Left lower leg: No edema.  Skin:    General: Skin is warm.     Capillary Refill: Capillary refill takes less than 2 seconds.  Neurological:     General: No focal deficit present.     Mental Status: He is alert and oriented to person, place, and time.  Psychiatric:        Mood and Affect: Mood normal.        Behavior: Behavior normal.     Labs reviewed: Basic Metabolic Panel: Recent Labs    10/02/22 0814 02/12/23 0816  NA 137 138  K 3.6 3.5  CL 101 102  CO2 23 24  GLUCOSE 127* 112*  BUN 17 19  CREATININE 0.95 0.82  CALCIUM 9.3 9.4   Liver Function Tests: Recent Labs    02/12/23 0816  AST 23  ALT 36  BILITOT 0.8  PROT 7.6   No results for input(s): "LIPASE", "AMYLASE" in the last 8760 hours. No results for input(s): "AMMONIA" in the last 8760 hours. CBC: Recent Labs    02/12/23 0816  WBC 10.3  NEUTROABS 5,232  HGB 16.9  HCT 50.0  MCV 91.4  PLT 195   Lipid Panel: Recent Labs    10/02/22 0814 02/12/23 0816  CHOL 107 119  HDL 40 45  LDLCALC 50 58  TRIG 86 80  CHOLHDL 2.7 2.6   TSH: No results for input(s): "TSH" in the last 8760 hours. A1C: Lab Results  Component Value Date    HGBA1C 6.7 (H) 02/12/2023     Assessment/Plan: 1.  Type 2 diabetes mellitus with microalbuminuria, without long-term current use of insulin (HCC) - A1c 6.7> at goal - urine microalbumin improved  - no hypoglycemias - foot exam next encounter - cont metformin, asa, losartan, and statin - eye exam next month> [employer] - discussed weight loss in relation to T2DM - losartan (COZAAR) 50 MG tablet; Take 1 tablet (50 mg total) by mouth daily.  Dispense: 90 tablet; Refill: 2 - metFORMIN (GLUCOPHAGE-XR) 500 MG 24 hr tablet; Take 1 tablet (500 mg total) by mouth 2 (two) times daily with a meal.  Dispense: 180 tablet; Refill: 2  2. Morbid obesity (HCC) - BMI 42.02 - recommend reduced calorie and portion sizes - very active - check thyroid level next lab draw  3. Hyperlipidemia associated with type 2 diabetes mellitus (HCC) - total 119, LDL 58> goal LDL < 70 - cont atorvastatin - recommend lipid panel yearly  - atorvastatin (LIPITOR) 20 MG tablet; Take 1 tablet (20 mg total) by mouth daily.  Dispense: 90 tablet; Refill: 2  4. Paroxysmal atrial fibrillation (HCC) - HR< 100 - cont metoprolol - metoprolol tartrate (LOPRESSOR) 100 MG tablet; Take 1 tablet (100 mg total) by mouth 2 (two) times daily.  Dispense: 180 tablet; Refill: 1  Total time: 35 minutes. Greater than 50% of total time spent doing patient education regarding health maintenance, T2DM, obesity, weight loss interventions, and PAF including symptom/medication management.    Next appt: 08/08/2023  Hazle Nordmann, Juel Burrow  [facility] [phone]

## 2023-03-27 ENCOUNTER — Telehealth: Payer: Self-pay

## 2023-03-27 NOTE — Telephone Encounter (Signed)
Patient called to cancel referral for medical weight management. He stated that he doesn't have insurance and just wants to cancel referral.  Referral has been cancelled.

## 2023-05-02 LAB — HM DIABETES EYE EXAM

## 2023-08-06 ENCOUNTER — Other Ambulatory Visit: Payer: Self-pay

## 2023-08-06 DIAGNOSIS — R809 Proteinuria, unspecified: Secondary | ICD-10-CM

## 2023-08-06 DIAGNOSIS — I480 Paroxysmal atrial fibrillation: Secondary | ICD-10-CM

## 2023-08-07 LAB — HEMOGLOBIN A1C
Hgb A1c MFr Bld: 7.2 %{Hb} — ABNORMAL HIGH (ref <5.7)
Mean Plasma Glucose: 160 mg/dL
eAG (mmol/L): 8.9 mmol/L

## 2023-08-07 LAB — BASIC METABOLIC PANEL WITH GFR
BUN: 21 mg/dL (ref 7–25)
CO2: 28 mmol/L (ref 20–32)
Calcium: 9.2 mg/dL (ref 8.6–10.3)
Chloride: 101 mmol/L (ref 98–110)
Creat: 0.94 mg/dL (ref 0.70–1.30)
Glucose, Bld: 124 mg/dL — ABNORMAL HIGH (ref 65–99)
Potassium: 4 mmol/L (ref 3.5–5.3)
Sodium: 139 mmol/L (ref 135–146)
eGFR: 95 mL/min/{1.73_m2} (ref >=60)

## 2023-08-07 LAB — CBC WITH DIFFERENTIAL/PLATELET
Absolute Lymphocytes: 4100 {cells}/uL — ABNORMAL HIGH (ref 850–3900)
Absolute Monocytes: 910 {cells}/uL (ref 200–950)
Basophils Absolute: 30 {cells}/uL (ref 0–200)
Basophils Relative: 0.3 %
Eosinophils Absolute: 160 {cells}/uL (ref 15–500)
Eosinophils Relative: 1.6 %
HCT: 51.1 % — ABNORMAL HIGH (ref 38.5–50.0)
Hemoglobin: 17.4 g/dL — ABNORMAL HIGH (ref 13.2–17.1)
MCH: 31 pg (ref 27.0–33.0)
MCHC: 34.1 g/dL (ref 32.0–36.0)
MCV: 91.1 fL (ref 80.0–100.0)
MPV: 9.8 fL (ref 7.5–12.5)
Monocytes Relative: 9.1 %
Neutro Abs: 4800 {cells}/uL (ref 1500–7800)
Neutrophils Relative %: 48 %
Platelets: 203 10*3/uL (ref 140–400)
RBC: 5.61 10*6/uL (ref 4.20–5.80)
RDW: 13 % (ref 11.0–15.0)
Total Lymphocyte: 41 %
WBC: 10 10*3/uL (ref 3.8–10.8)

## 2023-08-07 LAB — MICROALBUMIN / CREATININE URINE RATIO
Creatinine, Urine: 154 mg/dL (ref 20–320)
Microalb Creat Ratio: 11 mg/g{creat} (ref <30)
Microalb, Ur: 1.7 mg/dL

## 2023-08-07 LAB — TSH: TSH: 2.55 m[IU]/L (ref 0.40–4.50)

## 2023-08-08 ENCOUNTER — Encounter: Payer: Self-pay | Admitting: Orthopedic Surgery

## 2023-08-08 ENCOUNTER — Ambulatory Visit (INDEPENDENT_AMBULATORY_CARE_PROVIDER_SITE_OTHER): Payer: Self-pay | Admitting: Orthopedic Surgery

## 2023-08-08 ENCOUNTER — Telehealth: Payer: Self-pay | Admitting: *Deleted

## 2023-08-08 VITALS — BP 142/92 | HR 79 | Temp 96.0°F | Resp 18 | Ht 75.0 in | Wt 346.8 lb

## 2023-08-08 DIAGNOSIS — E1129 Type 2 diabetes mellitus with other diabetic kidney complication: Secondary | ICD-10-CM

## 2023-08-08 DIAGNOSIS — R809 Proteinuria, unspecified: Secondary | ICD-10-CM

## 2023-08-08 DIAGNOSIS — J029 Acute pharyngitis, unspecified: Secondary | ICD-10-CM

## 2023-08-08 DIAGNOSIS — Z1211 Encounter for screening for malignant neoplasm of colon: Secondary | ICD-10-CM

## 2023-08-08 MED ORDER — METFORMIN HCL ER 500 MG PO TB24
ORAL_TABLET | ORAL | 2 refills | Status: DC
Start: 2023-08-08 — End: 2024-03-20
  Filled 2024-01-22: qty 180, 60d supply, fill #0

## 2023-08-08 NOTE — Patient Instructions (Addendum)
Increase metformin to 2 tablets with breakfast and 1 tablet with dinner  Limit carbs to one serving daily  I will look into assistance programs for colonoscopy  Schedule lab visit to recheck A1c in 3 months   NEXT ROUTINE VISIT > IN JUNE 2025 > FASTING LABS WILL BE NEEDED   Warm fluids for sore throat  Salt water gargles  Try humidifier ar bedtime  Cepacol lozenges for sore throat

## 2023-08-08 NOTE — Progress Notes (Signed)
Complex Care Management Note   08/08/2023 Name: Richard Mullins MRN: [id number] DOB: 08-31-1965  [name] is a 57 y.o. year old male who sees [place], Amy E, NP for primary care. I reached out to [name] by phone today to offer complex care management services.  [name] was given information about Complex Care Management services today including:   The Complex Care Management services include support from the care team which includes your Nurse Coordinator, Clinical Social Worker, or Pharmacist.  The Complex Care Management team is here to help remove barriers to the health concerns and goals most important to you. Complex Care Management services are voluntary, and the patient may decline or stop services at any time by request to their care team member.   Complex Care Management Consent Status: Patient agreed to services and verbal consent obtained.   Follow up plan:  Telephone appointment with complex care management team member scheduled for:  08/23/23  Encounter Outcome:  Patient Scheduled  [facility] Coordination Care Guide  Direct Dial: [phone]

## 2023-08-08 NOTE — Progress Notes (Signed)
Careteam: Patient Care Team: Octavia Heir, NP as PCP - General (Adult Health Nurse Practitioner) Lyn Records, MD (Inactive) as PCP - Cardiology (Cardiology) Artis Delay, MD as Consulting Physician (Hematology and Oncology)  Seen by: Hazle Nordmann, AGNP-C  PLACE OF SERVICE:  [facility]  Advanced Directive information Does Patient Have a Medical Advance Directive?: No, Would patient like information on creating a medical advance directive?: No - Patient declined  Allergies  Allergen Reactions   Bee Pollen     Allergies   Monosodium Glutamate Diarrhea, Hives and Nausea And Vomiting    Chief Complaint  Patient presents with   Medical Management of Chronic Issues    6 month follow up.    Immunizations    Discuss the need for Influenza vaccine, Covid Booster, and Shingrix vaccine.    Health Maintenance    Discuss the need for Colonscopy.      HPI: Patient is a 57 y.o. male seen today for medical management of chronic conditions.   Lab results discussed with patient.   A1c increased to 7.2> was 6.7. He is checking sugar daily. No hypoglycemia. He is taking metformin as prescribed. Does not always adhere to low carb diet. He did stop eating chocolate chips a few weeks ago.   Family history colon cancer> eldest sister and aunt. He is willing to have screening procedure done but unable to afford.   BMI 43.35. Weight loss benefits discussed. He is very active and rides bicycle about 9 miles daily.   Sore throat for past few days. No fever or body aches. Able to swallow fluids and foods without difficulty. Admits to allergies and taking Claritin. He reports recent chemical exposure in ventilation system.    Review of Systems:  Review of Systems  Constitutional: Negative.   HENT:  Positive for congestion and sore throat.   Eyes: Negative.   Respiratory: Negative.    Cardiovascular: Negative.   Gastrointestinal: Negative.   Genitourinary: Negative.   Musculoskeletal:  Negative.   Skin: Negative.   Neurological: Negative.   Psychiatric/Behavioral: Negative.      Past Medical History:  Diagnosis Date   Atrial fibrillation (HCC) 09/20/2020   Complication of anesthesia    DURING DENTAL PROCEDURE   Diabetes mellitus without complication (HCC)    Gastroesophageal reflux disease    History of blood clots 09/20/2020   History of COVID-19 2022   PONV (postoperative nausea and vomiting)    Psoriasis    Past Surgical History:  Procedure Laterality Date   DENTAL SURGERY  08/26/2022   Fill In   ingrown toenails  1995   WISDOM TOOTH EXTRACTION     Social History:   reports that he has never smoked. He has never used smokeless tobacco. He reports current alcohol use. He reports that he does not currently use drugs.  Family History  Problem Relation Age of Onset   Lung cancer Father    Colon cancer Sister    Heart attack Maternal Uncle 40   High blood pressure Sister    ADD / ADHD Sister    Diabetes Sister     Medications: Patient's Medications  New Prescriptions   No medications on file  Previous Medications   ACETAMINOPHEN (TYLENOL) 500 MG TABLET    Take 500 mg by mouth as needed.   ASPIRIN EC 81 MG TABLET    Take 81 mg by mouth daily. Swallow whole. Pt takes in the morning.   ATORVASTATIN (LIPITOR) 20 MG TABLET  Take 1 tablet (20 mg total) by mouth daily.   BLOOD GLUCOSE MONITORING SUPPL (RELION CONFIRM GLUCOSE MONITOR) W/DEVICE KIT    by Does not apply route as directed.   CASANTHRANOL-DOCUSATE SODIUM (STOOL SOFTENER PLUS PO)    Take by mouth every other day.   CLOTRIMAZOLE (LOTRIMIN) 1 % CREAM    Apply 1 application topically as needed (for foot fungus).   IBUPROFEN PO    Take 1 tablet by mouth as needed.   LOPERAMIDE (IMODIUM) 2 MG CAPSULE    Take by mouth as needed for diarrhea or loose stools.   LOSARTAN (COZAAR) 50 MG TABLET    Take 1 tablet (50 mg total) by mouth daily.   METFORMIN (GLUCOPHAGE-XR) 500 MG 24 HR TABLET    Take 1  tablet (500 mg total) by mouth 2 (two) times daily with a meal.   METOPROLOL TARTRATE (LOPRESSOR) 100 MG TABLET    Take 1 tablet (100 mg total) by mouth 2 (two) times daily.   MULTIPLE VITAMIN (MULTIVITAMIN ADULT PO)    Take 1 tablet by mouth once a week.  Modified Medications   No medications on file  Discontinued Medications   No medications on file    Physical Exam:  Vitals:   08/08/23 0857  BP: (!) 146/84  Pulse: 79  Resp: 18  Temp: (!) 96 F (35.6 C)  SpO2: 98%  Weight: (!) 346 lb 12.8 oz (157.3 kg)  Height: 6\' 3"  (1.905 m)   Body mass index is 43.35 kg/m. Wt Readings from Last 3 Encounters:  08/08/23 (!) 346 lb 12.8 oz (157.3 kg)  02/14/23 (!) 336 lb 3.2 oz (152.5 kg)  10/11/22 (!) 346 lb 12.8 oz (157.3 kg)    Physical Exam Vitals reviewed.  Constitutional:      Appearance: He is obese.  HENT:     Head: Normocephalic.     Nose: Nose normal.     Mouth/Throat:     Mouth: Mucous membranes are moist.     Pharynx: No posterior oropharyngeal erythema.     Tonsils: No tonsillar exudate or tonsillar abscesses.  Eyes:     General:        Right eye: No discharge.        Left eye: No discharge.  Cardiovascular:     Rate and Rhythm: Normal rate and regular rhythm.     Pulses: Normal pulses.     Heart sounds: Normal heart sounds.  Pulmonary:     Effort: Pulmonary effort is normal. No respiratory distress.     Breath sounds: Normal breath sounds. No wheezing.  Abdominal:     General: Bowel sounds are normal.     Palpations: Abdomen is soft.  Musculoskeletal:     Cervical back: Neck supple.     Right lower leg: No edema.     Left lower leg: No edema.  Lymphadenopathy:     Cervical: No cervical adenopathy.  Skin:    General: Skin is warm.     Capillary Refill: Capillary refill takes less than 2 seconds.  Neurological:     General: No focal deficit present.     Mental Status: He is alert and oriented to person, place, and time.  Psychiatric:        Mood and  Affect: Mood normal.     Labs reviewed: Basic Metabolic Panel: Recent Labs    10/02/22 0814 02/12/23 0816 08/06/23 0817  NA 137 138 139  K 3.6 3.5 4.0  CL 101 102  101  CO2 23 24 28   GLUCOSE 127* 112* 124*  BUN 17 19 21   CREATININE 0.95 0.82 0.94  CALCIUM 9.3 9.4 9.2  TSH  --   --  2.55   Liver Function Tests: Recent Labs    02/12/23 0816  AST 23  ALT 36  BILITOT 0.8  PROT 7.6   No results for input(s): "LIPASE", "AMYLASE" in the last 8760 hours. No results for input(s): "AMMONIA" in the last 8760 hours. CBC: Recent Labs    02/12/23 0816 08/06/23 0817  WBC 10.3 10.0  NEUTROABS 5,232 4,800  HGB 16.9 17.4*  HCT 50.0 51.1*  MCV 91.4 91.1  PLT 195 203   Lipid Panel: Recent Labs    10/02/22 0814 02/12/23 0816  CHOL 107 119  HDL 40 45  LDLCALC 50 58  TRIG 86 80  CHOLHDL 2.7 2.6   TSH: Recent Labs    08/06/23 0817  TSH 2.55   A1C: Lab Results  Component Value Date   HGBA1C 7.2 (H) 08/06/2023     Assessment/Plan 1. Type 2 diabetes mellitus with microalbuminuria, without long-term current use of insulin (HCC) (Primary) - A1c now 7.2 - will increase metformin to 1000 mg with breakfast - cont metformin 500 mg with dinner - recommend weight loss to deceased A1c - limit carbs and sugars in diet - microalbumin improved  - cont losartan - eye exam due 04/2024 - plan to recheck A1c in 3 months - metFORMIN (GLUCOPHAGE-XR) 500 MG 24 hr tablet; Take 2 tablets (1,000 mg total) by mouth daily with breakfast AND 1 tablet (500 mg total) daily with supper.  Dispense: 180 tablet; Refill: 2 - Hemoglobin A1c; Future  2. Morbid obesity (HCC) - BMI 43.35 - heaviest weight 370 lbs  - rides bike about 9 miles daily - suspect increased weight from oral intake - recommend limit portion sizes - recommend < 2000 calories daily  - nutritionist consult made in past> not done since he does not have insurance  3. Colon cancer screening - colonoscopy never done -  older sister and aunt had colon cancer - does not have insurance  - will refer to care management for possible financial assistance/programs  - AMB Referral [facility]I Care Management - Ambulatory referral to Gastroenterology  4. Sore throat - began a few days ago - exam unremarkable - cont warm fluids - recommend salt water gargles - may try Cepacol - do not recommend prednisone due to T2DM  Total time: 34 minutes. Greater than 50% of total time spent doing patient education regarding health maintenance, T2DM, weight loss and exercise including symptom/medication management.     Next appt: Visit date not found  Kanton Kamel Scherry Ran  [facility] [phone]

## 2023-08-23 ENCOUNTER — Ambulatory Visit: Payer: Self-pay | Admitting: Licensed Clinical Social Worker

## 2023-08-23 NOTE — Patient Outreach (Signed)
  Care Coordination   Initial Visit Note   08/23/2023 Name: Zylen Wenig MRN: [id number] DOB: 1966/01/21  Hayes Czaja is a 58 y.o. year old male who sees Fargo, Amy E, NP for primary care. I spoke with  Alm [place] by phone today.  What matters to the patients health and wellness today?  Financial strain to [employer], Medicaid    Goals Addressed             This Visit's Progress    Care coordination Activities       Care Coordination Interventions: Patient stated that he has assests but no insurance, SW will email the link to apply for [facility] and Medicaid  Patient stated that he was going to apply for Obama care, but felt as if he may not get approved or thought it would be difficult.   SW went over all the SDOH Questions and no other needs at this time. SW will follow up on 09/13/2023 at 11:15 am        SDOH assessments and interventions completed:  Yes  SDOH Interventions Today    Flowsheet Row Most Recent Value  SDOH Interventions   Food Insecurity Interventions Intervention Not Indicated  Housing Interventions Intervention Not Indicated  Transportation Interventions Intervention Not Indicated  Utilities Interventions Intervention Not Indicated  Financial Strain Interventions Other (Comment)  [Patient has assests but no insurance, SW will email the link to apply for SNAP and Medicaid]        Care Coordination Interventions:  Yes, provided   Follow up plan: Follow up call scheduled for 09/13/2023 at 11:15 am    Encounter Outcome:  Patient Visit Completed  Tobias CHARM Maranda HEDWIG, PhD [facility], [facility] Social Worker Direct Dial: [phone]  Fax: [phone]

## 2023-08-23 NOTE — Patient Instructions (Signed)
 Visit Information  Thank you for taking time to visit with me today. Please don't hesitate to contact me if I can be of assistance to you.   Following are the goals we discussed today:   Goals Addressed             This Visit's Progress    Care coordination Activities       Care Coordination Interventions: Patient stated that he has assests but no insurance, SW will email the link to apply for [facility] and Medicaid  Patient stated that he was going to apply for Obama care, but felt as if he may not get approved or thought it would be difficult.   SW went over all the SDOH Questions and no other needs at this time. SW will follow up on 09/13/2023 at 11:15 am        Our next appointment is by telephone on 09/13/2023 at 11:15 am  Please call the care guide team at [phone] if you need to cancel or reschedule your appointment.   If you are experiencing a Mental Health or Behavioral Health Crisis or need someone to talk to, please call the Suicide and Crisis Lifeline: 988 call the [place]  National Suicide Prevention Lifeline: (207) 020-7207 or TTY: 858-828-0417 TTY 410-425-8395) to talk to a trained counselor go to [facility] [address], [place] [phone]) call 911  Patient verbalizes understanding of instructions and care plan provided today and agrees to view in MyChart. Active MyChart status and patient understanding of how to access instructions and care plan via MyChart confirmed with patient.     Richard CHARM Maranda HEDWIG, PhD [facility], [facility] Social Worker Direct Dial: [phone]  Fax: [phone]

## 2023-08-28 ENCOUNTER — Other Ambulatory Visit: Payer: Self-pay | Admitting: Orthopedic Surgery

## 2023-08-28 DIAGNOSIS — I480 Paroxysmal atrial fibrillation: Secondary | ICD-10-CM

## 2023-09-13 ENCOUNTER — Ambulatory Visit: Payer: Self-pay | Admitting: Licensed Clinical Social Worker

## 2023-09-13 NOTE — Patient Outreach (Signed)
  Care Coordination   Follow Up Visit Note   09/13/2023 Name: Richard Mullins MRN: [id number] DOB: September 25, 1965  Richard Mullins is a 58 y.o. year old male who sees Fargo, Amy E, NP for primary care. I spoke with  Richard Mullins by phone today.  What matters to the patients health and wellness today?  Medicaid application status    Goals Addressed             This Visit's Progress    Care coordination Activities       Care Coordination Interventions: Patient stated that he has assests but no insurance, SW emailed the link to apply for [facility] and Medicaid and the patient stated that he did apply but is having some issues with uploading the paperwork. SW educated the patient on the walk in clinic and the SW will send a referral to the walk in clinic for for someone to contact the patient to assist with his questions.  SW went over all the SDOH Questions and no other needs at this time. SW will follow up on 10/03/2023 at 11:00 am        SDOH assessments and interventions completed:  Yes  SDOH Interventions Today    Flowsheet Row Most Recent Value  SDOH Interventions   Food Insecurity Interventions Intervention Not Indicated  Housing Interventions Intervention Not Indicated  Transportation Interventions Intervention Not Indicated  Utilities Interventions Intervention Not Indicated        Care Coordination Interventions:  Yes, provided  Interventions Today    Flowsheet Row Most Recent Value  General Interventions   General Interventions Discussed/Reviewed General Interventions Reviewed, Science writer applied for Medicaid and waiting approval]        Follow up plan: Follow up call scheduled for 10/03/2023 at 11:00 am    Encounter Outcome:  Patient Visit Completed   Jeanie Cooks, PhD [facility], [facility] Social Worker Direct Dial: [phone]  Fax: [phone]

## 2023-09-13 NOTE — Patient Instructions (Signed)
Visit Information  Thank you for taking time to visit with me today. Please don't hesitate to contact me if I can be of assistance to you.   Following are the goals we discussed today:   Goals Addressed             This Visit's Progress    Care coordination Activities       Care Coordination Interventions: Patient stated that he has assests but no insurance, SW emailed the link to apply for [employer] and Medicaid and the patient stated that he did apply but is having some issues with uploading the paperwork. SW educated the patient on the walk in clinic and the SW will send a referral to the walk in clinic for for someone to contact the patient to assist with his questions.  SW went over all the SDOH Questions and no other needs at this time. SW will follow up on 10/03/2023 at 11:00 am        Our next appointment is by telephone on 10/03/2023 at 11:00 am  Please call the care guide team at [phone] if you need to cancel or reschedule your appointment.   If you are experiencing a Mental Health or Behavioral Health Crisis or need someone to talk to, please call the Suicide and Crisis Lifeline: 988 go to [facility] [address] [place] [phone]) call 911  Patient verbalizes understanding of instructions and care plan provided today and agrees to view in MyChart. Active MyChart status and patient understanding of how to access instructions and care plan via MyChart confirmed with patient.     Jeanie Cooks, PhD [facility], [facility] Social Worker Direct Dial: [phone]  Fax: [phone]

## 2023-10-03 ENCOUNTER — Ambulatory Visit: Payer: Self-pay | Admitting: Licensed Clinical Social Worker

## 2023-10-03 ENCOUNTER — Encounter: Payer: Self-pay | Admitting: Orthopedic Surgery

## 2023-10-03 NOTE — Patient Outreach (Signed)
  Care Coordination   Follow Up Visit Note   10/03/2023 Name: Richard Mullins MRN: [id number] DOB: 11/01/1965  Richard Mullins is a 58 y.o. year old male who sees Fargo, Amy E, NP for primary care. I spoke with  Richard Mullins by phone today.  What matters to the patients health and wellness today?  Medicaid    Goals Addressed             This Visit's Progress    COMPLETED: Care coordination Activities       Care Coordination Interventions: Patient stated that he has assests but no insurance, SW emailed the link to apply for [facility] and Medicaid and the patient stated that he did apply but is having some issues with uploading the paperwork. SW educated the patient on the walk in clinic and the SW will send a referral to the walk in clinic for for someone to contact the patient to assist with his questions.  SW went over all the SDOH Questions and no other needs at this time. SW will follow up on 10/03/2023 at 11:00 am        SDOH assessments and interventions completed:  Yes  SDOH Interventions Today    Flowsheet Row Most Recent Value  SDOH Interventions   Food Insecurity Interventions Intervention Not Indicated  Housing Interventions Intervention Not Indicated  Transportation Interventions Intervention Not Indicated  Utilities Interventions Intervention Not Indicated        Care Coordination Interventions:  Yes, provided  Interventions Today    Flowsheet Row Most Recent Value  General Interventions   General Interventions Discussed/Reviewed General Interventions Reviewed, [employer] is going to reapply for medicaid in 60 days]        Follow up plan: No further intervention required.   Encounter Outcome:  Patient Visit Completed   Richard Cooks, PhD [facility], [facility] Social Worker Direct Dial: [phone]  Fax: [phone]

## 2023-10-03 NOTE — Patient Instructions (Signed)
Visit Information  Thank you for taking time to visit with me today. Please don't hesitate to contact me if I can be of assistance to you.   Following are the goals we discussed today:   Goals Addressed             This Visit's Progress    COMPLETED: Care coordination Activities       Care Coordination Interventions: Patient stated that he has assests but no insurance, SW emailed the link to apply for [facility] and Medicaid and the patient stated that he did apply but is having some issues with uploading the paperwork. SW educated the patient on the walk in clinic and the SW will send a referral to the walk in clinic for for someone to contact the patient to assist with his questions.  SW went over all the SDOH Questions and no other needs at this time. SW will follow up on 10/03/2023 at 11:00 am        No further follow up and patient is going to reapply for medicaid in about 60 days. Sw encouraged the patient to let the PCP know if appointment is needed with the SW in the future.   Please call the care guide team at [phone] if you need to cancel or reschedule your appointment.   If you are experiencing a Mental Health or Behavioral Health Crisis or need someone to talk to, please call the Suicide and Crisis Lifeline: 988 go to [facility]gent [facility] [address] [place] [phone]) call 911  Patient verbalizes understanding of instructions and care plan provided today and agrees to view in MyChart. Active MyChart status and patient understanding of how to access instructions and care plan via MyChart confirmed with patient.     Jeanie Cooks, PhD [facility], [facility] Social Worker Direct Dial: [phone]  Fax: [phone]

## 2023-11-05 ENCOUNTER — Other Ambulatory Visit: Payer: Self-pay

## 2023-11-05 DIAGNOSIS — E1129 Type 2 diabetes mellitus with other diabetic kidney complication: Secondary | ICD-10-CM

## 2023-11-06 ENCOUNTER — Encounter: Payer: Self-pay | Admitting: Orthopedic Surgery

## 2023-11-06 LAB — HEMOGLOBIN A1C
Hgb A1c MFr Bld: 6.4 %{Hb} — ABNORMAL HIGH (ref <5.7)
Mean Plasma Glucose: 137 mg/dL
eAG (mmol/L): 7.6 mmol/L

## 2023-11-27 ENCOUNTER — Other Ambulatory Visit (HOSPITAL_COMMUNITY): Payer: Self-pay

## 2023-11-27 MED ORDER — METOPROLOL TARTRATE 100 MG PO TABS
100.0000 mg | ORAL_TABLET | Freq: Two times a day (BID) | ORAL | 1 refills | Status: DC
  Filled 2023-11-27: qty 180, 90d supply, fill #0

## 2023-11-27 MED ORDER — METFORMIN HCL ER 500 MG PO TB24
ORAL_TABLET | ORAL | 2 refills | Status: DC
  Filled 2023-11-27: qty 180, 60d supply, fill #0

## 2023-11-27 MED ORDER — METFORMIN HCL ER 500 MG PO TB24: 500.0000 mg | ORAL_TABLET | Freq: Two times a day (BID) | ORAL | 2 refills | Status: DC

## 2023-11-28 ENCOUNTER — Other Ambulatory Visit (HOSPITAL_COMMUNITY): Payer: Self-pay

## 2023-11-28 ENCOUNTER — Other Ambulatory Visit: Payer: Self-pay

## 2023-12-31 ENCOUNTER — Telehealth: Payer: Self-pay | Admitting: Orthopedic Surgery

## 2023-12-31 ENCOUNTER — Other Ambulatory Visit (HOSPITAL_COMMUNITY): Payer: Self-pay

## 2023-12-31 ENCOUNTER — Other Ambulatory Visit: Payer: Self-pay | Admitting: Orthopedic Surgery

## 2023-12-31 DIAGNOSIS — E785 Hyperlipidemia, unspecified: Secondary | ICD-10-CM

## 2023-12-31 DIAGNOSIS — R809 Proteinuria, unspecified: Secondary | ICD-10-CM

## 2023-12-31 MED ORDER — LOSARTAN POTASSIUM 50 MG PO TABS
50.0000 mg | ORAL_TABLET | Freq: Every day | ORAL | 1 refills | Status: DC
  Filled 2023-12-31: qty 90, 90d supply, fill #0
  Filled 2024-03-20: qty 90, 90d supply, fill #1

## 2023-12-31 MED ORDER — ATORVASTATIN CALCIUM 20 MG PO TABS
20.0000 mg | ORAL_TABLET | Freq: Every day | ORAL | 1 refills | Status: DC
  Filled 2023-12-31: qty 90, 90d supply, fill #0

## 2023-12-31 NOTE — Telephone Encounter (Signed)
 Refill already completed by PCP Arnetha Bhat, NP

## 2023-12-31 NOTE — Telephone Encounter (Signed)
 Duplicate request   Copied from CRM [id number]. Topic: Clinical - Medication Refill >> Dec 31, 2023  3:09 PM Tiffany H wrote: Medication: atorvastatin  (LIPITOR) 20 MG tablet [[id number]] losartan  (COZAAR ) 50 MG tablet [[id number]]  Has the patient contacted their pharmacy? Yes (Agent: If no, request that the patient contact the pharmacy for the refill. If patient does not wish to contact the pharmacy document the reason why and proceed with request.) (Agent: If yes, when and what did the pharmacy advise?)  This is the patient's preferred pharmacy:  [facility] [address] [place] [address] Phone: [phone] Fax: [phone]  Is this the correct pharmacy for this prescription? Yes If no, delete pharmacy and type the correct one.   Has the prescription been filled recently? No  Is the patient out of the medication? Yes  Has the patient been seen for an appointment in the last year OR does the patient have an upcoming appointment? Yes  Can we respond through MyChart? Yes  Agent: Please be advised that Rx refills may take up to 3 business days. We ask that you follow-up with your pharmacy.

## 2023-12-31 NOTE — Telephone Encounter (Signed)
 Copied from CRM [id number]. Topic: Clinical - Medication Refill >> Dec 31, 2023  3:09 PM Tiffany H wrote: Medication: atorvastatin  (LIPITOR) 20 MG tablet [[id number]] losartan  (COZAAR ) 50 MG tablet [[id number]]  Has the patient contacted their pharmacy? Yes (Agent: If no, request that the patient contact the pharmacy for the refill. If patient does not wish to contact the pharmacy document the reason why and proceed with request.) (Agent: If yes, when and what did the pharmacy advise?)  This is the patient's preferred pharmacy:  [facility] [address] [place] [address] Phone: [phone] Fax: [phone]  Is this the correct pharmacy for this prescription? Yes If no, delete pharmacy and type the correct one.   Has the prescription been filled recently? No  Is the patient out of the medication? Yes  Has the patient been seen for an appointment in the last year OR does the patient have an upcoming appointment? Yes  Can we respond through MyChart? Yes  Agent: Please be advised that Rx refills may take up to 3 business days. We ask that you follow-up with your pharmacy.

## 2023-12-31 NOTE — Telephone Encounter (Signed)
 Medication list reviewed and reconciled. Duplicate medication were removed from patient chart

## 2023-12-31 NOTE — Telephone Encounter (Signed)
 Patient came in stated that he went and transferred all his medication to [facility] but was concerned that the Metformin  and Metoprol Tartrate is on the chart several times. Asking if this can be fixed please. Also just wanted to inform PCP about the change so all future Rxes can be sent accordingly.

## 2024-01-22 ENCOUNTER — Other Ambulatory Visit (HOSPITAL_COMMUNITY): Payer: Self-pay

## 2024-01-22 ENCOUNTER — Other Ambulatory Visit: Payer: Self-pay

## 2024-02-11 ENCOUNTER — Other Ambulatory Visit: Payer: Self-pay

## 2024-02-11 DIAGNOSIS — E1169 Type 2 diabetes mellitus with other specified complication: Secondary | ICD-10-CM

## 2024-02-11 DIAGNOSIS — E1129 Type 2 diabetes mellitus with other diabetic kidney complication: Secondary | ICD-10-CM

## 2024-02-12 ENCOUNTER — Ambulatory Visit: Payer: Self-pay | Admitting: Orthopedic Surgery

## 2024-02-12 LAB — BASIC METABOLIC PANEL WITHOUT GFR
BUN: 22 mg/dL (ref 7–25)
CO2: 23 mmol/L (ref 20–32)
Calcium: 8.9 mg/dL (ref 8.6–10.3)
Chloride: 101 mmol/L (ref 98–110)
Creat: 0.92 mg/dL (ref 0.70–1.30)
Glucose, Bld: 126 mg/dL — ABNORMAL HIGH (ref 65–99)
Potassium: 3.7 mmol/L (ref 3.5–5.3)
Sodium: 136 mmol/L (ref 135–146)

## 2024-02-12 LAB — LIPID PANEL
Cholesterol: 116 mg/dL (ref <200)
HDL: 42 mg/dL (ref >=40)
LDL Cholesterol (Calc): 58 mg/dL
Non-HDL Cholesterol (Calc): 74 mg/dL (ref <130)
Total CHOL/HDL Ratio: 2.8 (calc) (ref <5.0)
Triglycerides: 76 mg/dL (ref <150)

## 2024-02-12 LAB — MICROALBUMIN / CREATININE URINE RATIO
Creatinine, Urine: 124 mg/dL (ref 20–320)
Microalb Creat Ratio: 13 mg/g{creat} (ref <30)
Microalb, Ur: 1.6 mg/dL

## 2024-02-12 LAB — HEMOGLOBIN A1C
Hgb A1c MFr Bld: 6.9 % — ABNORMAL HIGH (ref <5.7)
Mean Plasma Glucose: 151 mg/dL
eAG (mmol/L): 8.4 mmol/L

## 2024-02-12 NOTE — Telephone Encounter (Signed)
Message routed to PCP Fargo, Amy E, NP  

## 2024-02-13 ENCOUNTER — Encounter: Payer: Self-pay | Admitting: Orthopedic Surgery

## 2024-02-13 ENCOUNTER — Ambulatory Visit (INDEPENDENT_AMBULATORY_CARE_PROVIDER_SITE_OTHER): Payer: Self-pay | Admitting: Orthopedic Surgery

## 2024-02-13 VITALS — BP 118/86 | HR 70 | Temp 97.9°F | Ht 75.0 in | Wt 339.0 lb

## 2024-02-13 DIAGNOSIS — E1169 Type 2 diabetes mellitus with other specified complication: Secondary | ICD-10-CM

## 2024-02-13 DIAGNOSIS — E1129 Type 2 diabetes mellitus with other diabetic kidney complication: Secondary | ICD-10-CM

## 2024-02-13 DIAGNOSIS — R809 Proteinuria, unspecified: Secondary | ICD-10-CM

## 2024-02-13 DIAGNOSIS — Z139 Encounter for screening, unspecified: Secondary | ICD-10-CM

## 2024-02-13 DIAGNOSIS — L602 Onychogryphosis: Secondary | ICD-10-CM

## 2024-02-13 DIAGNOSIS — E785 Hyperlipidemia, unspecified: Secondary | ICD-10-CM

## 2024-02-13 NOTE — Progress Notes (Signed)
 Careteam: Patient Care Team: Gil Greig BRAVO, NP as PCP - General (Adult Health Nurse Practitioner) Claudene Victory ORN, MD (Inactive) as PCP - Cardiology (Cardiology) Lonn Hicks, MD as Consulting Physician (Hematology and Oncology)  Seen by: Greig Gil, AGNP-C  PLACE OF SERVICE:  [facility]  Advanced Directive information    Allergies  Allergen Reactions   Bee Pollen     Allergies   Monosodium Glutamate Diarrhea, Hives and Nausea And Vomiting    Chief Complaint  Patient presents with   Follow-up    6 month follow up.  Patient would like to discuss medications and weight loss Discuss the need for Foot exam, Hep B and colonoscopy     HPI: Patient is a 58 y.o. male seen today for medical management of chronic conditions.   Needs referral medicaid with social worker    Discussed the use of AI scribe software for clinical note transcription with the patient, who gave verbal consent to proceed.  History of Present Illness    Richard Mullins is a 58 year old male with type 2 diabetes who presents for a routine checkup.  His type 2 diabetes is managed with metformin , taking two pills in the morning and one in the evening. His A1c has increased from 6.4% three months ago to 6.9%. Despite a consistent diet and exercise regimen, he is concerned about the impact of his medications on weight management and finds it difficult to lose weight. He rarely consumes bread.Diabetic foot exam done today.   He has a history of elevated LDL cholesterol, previously at 89 mg/dL, now maintained below 70 mg/dL with atorvastatin .   He experiences weight fluctuations between 330 to 335 pounds, occasionally reaching 340 pounds. 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  He has thickened and yellowish toenails, which he attributes to diabetes. He has used clotrimazole cream for fungal infections and has had ingrown toenails. He mentions a  home remedy of soaking his feet in urine, which he has not done recently. No numbness in his feet.      He does not have insurance. He is trying to get approval for medicaid in the fall. Plan to make referral to [facility] social worker next visit.   Review of Systems:  ROS  Past Medical History:  Diagnosis Date   Atrial fibrillation (HCC) 09/20/2020   Complication of anesthesia    DURING DENTAL PROCEDURE   Diabetes mellitus without complication (HCC)    Gastroesophageal reflux disease    History of blood clots 09/20/2020   History of COVID-19 2022   PONV (postoperative nausea and vomiting)    Psoriasis    Past Surgical History:  Procedure Laterality Date   DENTAL SURGERY  08/26/2022   Fill In   ingrown toenails  1995   WISDOM TOOTH EXTRACTION     Social History:   reports that he has never smoked. He has never used smokeless tobacco. He reports current alcohol use. He reports that he does not currently use drugs.  Family History  Problem Relation Age of Onset   Lung cancer Father    Colon cancer Sister    Heart attack Maternal Uncle 77   High blood pressure Sister    ADD / ADHD Sister    Diabetes Sister     Medications: Patient's Medications  New Prescriptions   No medications on file  Previous Medications   ACETAMINOPHEN  (TYLENOL ) 500 MG TABLET  Take 500 mg by mouth as needed.   ASPIRIN  EC 81 MG TABLET    Take 81 mg by mouth daily. Swallow whole. Pt takes in the morning.   ATORVASTATIN  (LIPITOR) 20 MG TABLET    Take 1 tablet (20 mg total) by mouth daily.   BLOOD GLUCOSE MONITORING SUPPL (RELION CONFIRM GLUCOSE MONITOR) W/DEVICE KIT    by Does not apply route as directed.   CASANTHRANOL-DOCUSATE SODIUM  (STOOL SOFTENER PLUS PO)    Take by mouth every other day.   CLOTRIMAZOLE (LOTRIMIN) 1 % CREAM    Apply 1 application topically as needed (for foot fungus).   IBUPROFEN PO    Take 1 tablet by mouth as needed.   LOPERAMIDE (IMODIUM) 2 MG CAPSULE    Take by mouth as needed  for diarrhea or loose stools.   LOSARTAN  (COZAAR ) 50 MG TABLET    Take 1 tablet (50 mg total) by mouth daily.   METFORMIN  (GLUCOPHAGE -XR) 500 MG 24 HR TABLET    Take 2 tablets (1,000 mg total) by mouth daily with breakfast AND 1 tablet (500 mg total) daily with supper.   METOPROLOL  TARTRATE (LOPRESSOR ) 100 MG TABLET    Take 1 tablet by mouth twice daily   MULTIPLE VITAMIN (MULTIVITAMIN ADULT PO)    Take 1 tablet by mouth once a week.  Modified Medications   No medications on file  Discontinued Medications   No medications on file    Physical Exam:  Vitals:   02/13/24 1108  BP: 118/86  Pulse: 70  Temp: 97.9 F (36.6 C)  SpO2: 98%  Weight: (!) 339 lb (153.8 kg)  Height: 6' 3 (1.905 m)   Body mass index is 42.37 kg/m. Wt Readings from Last 3 Encounters:  02/13/24 (!) 339 lb (153.8 kg)  08/08/23 (!) 346 lb 12.8 oz (157.3 kg)  02/14/23 (!) 336 lb 3.2 oz (152.5 kg)    Physical Exam Vitals reviewed.  Constitutional:      Appearance: He is obese.  HENT:     Head: Normocephalic.   Eyes:     General:        Right eye: No discharge.        Left eye: No discharge.    Cardiovascular:     Rate and Rhythm: Normal rate and regular rhythm.     Pulses:          Dorsalis pedis pulses are 1+ on the right side and 1+ on the left side.       Posterior tibial pulses are 1+ on the right side and 1+ on the left side.     Heart sounds: Normal heart sounds.  Pulmonary:     Effort: Pulmonary effort is normal.     Breath sounds: Normal breath sounds.  Abdominal:     Palpations: Abdomen is soft.   Musculoskeletal:     Cervical back: Neck supple.     Right lower leg: No edema.     Left lower leg: No edema.  Feet:     Right foot:     Protective Sensation: 10 sites tested.  10 sites sensed.     Skin integrity: Dry skin present.     Toenail Condition: Right toenails are abnormally thick. Fungal disease present.    Left foot:     Protective Sensation: 10 sites tested.  10 sites  sensed.     Skin integrity: Dry skin present.     Toenail Condition: Left toenails are abnormally thick. Fungal disease  present.  Skin:    General: Skin is warm.     Capillary Refill: Capillary refill takes less than 2 seconds.     Comments: Chronic venous insufficiency to BLE   Neurological:     General: No focal deficit present.     Mental Status: He is alert and oriented to person, place, and time.   Psychiatric:        Mood and Affect: Mood normal.     Labs reviewed: Basic Metabolic Panel: Recent Labs    08/06/23 0817 02/11/24 0825  NA 139 136  K 4.0 3.7  CL 101 101  CO2 28 23  GLUCOSE 124* 126*  BUN 21 22  CREATININE 0.94 0.92  CALCIUM  9.2 8.9  TSH 2.55  --    Liver Function Tests: No results for input(s): AST, ALT, ALKPHOS, BILITOT, PROT, ALBUMIN in the last 8760 hours. No results for input(s): LIPASE, AMYLASE in the last 8760 hours. No results for input(s): AMMONIA in the last 8760 hours. CBC: Recent Labs    08/06/23 0817  WBC 10.0  NEUTROABS 4,800  HGB 17.4*  HCT 51.1*  MCV 91.1  PLT 203   Lipid Panel: Recent Labs    02/11/24 0825  CHOL 116  HDL 42  LDLCALC 58  TRIG 76  CHOLHDL 2.8   TSH: Recent Labs    08/06/23 0817  TSH 2.55   A1C: Lab Results  Component Value Date   HGBA1C 6.9 (H) 02/11/2024     Assessment/Plan 1. Type 2 diabetes mellitus with microalbuminuria, without long-term current use of insulin  (HCC) (Primary) - A1c 6.9> was 6.4, goal < 7 - foot exam done today - cont asa, ARB and statin - cont metformin  - discussed diabetic diet and weight loss  2. Morbid obesity with body mass index (BMI) of 40.0 or higher (HCC) - BMI 42.37 - recommend counting calories x 1 week - recommend < 1800 calories daily - limit portion sizes - rides bicycle for transportation average 1 lb per week - goal to lose 15-20 lbs by next visit>   3. Hyperlipidemia associated with type 2 diabetes mellitus (HCC) - LDL 58,  goal < 70 - cont statin  4. Encounter for screening involving social determinants of health (SDoH) - no insurance - trouble scheduling nutritionist visit and screening colonoscopy  - consider [facility] social work referral for medicaid this fall   5. 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 Greater than 50% of total time spent doing patient education regarding health maintenance, SDOH, T2DM and weight loss including symptom/medication management.    Next appt: 06/18/2024  Greig Gil BODILY  [facility] [phone]

## 2024-02-13 NOTE — Patient Instructions (Addendum)
 Track your calories x 1 week> recommend  my fitness pal app> free on phone  Consider Whole 30 weight loss plan> look for information on internet

## 2024-02-19 ENCOUNTER — Other Ambulatory Visit (HOSPITAL_COMMUNITY): Payer: Self-pay

## 2024-02-19 MED FILL — Metoprolol Tartrate Tab 100 MG: ORAL | 90 days supply | Qty: 180 | Fill #0 | Status: AC

## 2024-03-20 ENCOUNTER — Other Ambulatory Visit: Payer: Self-pay | Admitting: Orthopedic Surgery

## 2024-03-20 ENCOUNTER — Other Ambulatory Visit (HOSPITAL_COMMUNITY): Payer: Self-pay

## 2024-03-20 ENCOUNTER — Encounter: Payer: Self-pay | Admitting: Orthopedic Surgery

## 2024-03-20 DIAGNOSIS — I480 Paroxysmal atrial fibrillation: Secondary | ICD-10-CM

## 2024-03-20 DIAGNOSIS — E1129 Type 2 diabetes mellitus with other diabetic kidney complication: Secondary | ICD-10-CM

## 2024-03-20 DIAGNOSIS — E1169 Type 2 diabetes mellitus with other specified complication: Secondary | ICD-10-CM

## 2024-03-20 MED ORDER — METFORMIN HCL ER 500 MG PO TB24
ORAL_TABLET | ORAL | 2 refills | Status: DC
  Filled 2024-03-20: qty 90, 30d supply, fill #0

## 2024-03-23 ENCOUNTER — Other Ambulatory Visit: Payer: Self-pay

## 2024-03-23 ENCOUNTER — Other Ambulatory Visit (HOSPITAL_COMMUNITY): Payer: Self-pay

## 2024-03-23 MED ORDER — ATORVASTATIN CALCIUM 20 MG PO TABS
20.0000 mg | ORAL_TABLET | Freq: Every day | ORAL | 1 refills | Status: AC
  Filled 2024-03-23: qty 90, 90d supply, fill #0
  Filled 2024-07-23: qty 90, 90d supply, fill #1

## 2024-03-23 MED ORDER — METFORMIN HCL ER 500 MG PO TB24
ORAL_TABLET | ORAL | 1 refills | Status: AC
  Filled 2024-03-23: qty 270, 90d supply, fill #0
  Filled 2024-07-29: qty 270, 90d supply, fill #1

## 2024-03-23 MED ORDER — LOSARTAN POTASSIUM 50 MG PO TABS
50.0000 mg | ORAL_TABLET | Freq: Every day | ORAL | 1 refills | Status: AC
  Filled 2024-06-22: qty 90, 90d supply, fill #0
  Filled 2024-09-23: qty 90, 90d supply, fill #1

## 2024-03-23 MED ORDER — METOPROLOL TARTRATE 100 MG PO TABS
100.0000 mg | ORAL_TABLET | Freq: Two times a day (BID) | ORAL | 1 refills | Status: AC
  Filled 2024-03-23 – 2024-05-21 (×2): qty 180, 90d supply, fill #0
  Filled 2024-08-24: qty 180, 90d supply, fill #1

## 2024-03-24 ENCOUNTER — Other Ambulatory Visit (HOSPITAL_COMMUNITY): Payer: Self-pay

## 2024-05-21 ENCOUNTER — Other Ambulatory Visit (HOSPITAL_COMMUNITY): Payer: Self-pay

## 2024-06-16 ENCOUNTER — Other Ambulatory Visit: Payer: Self-pay

## 2024-06-16 DIAGNOSIS — E1129 Type 2 diabetes mellitus with other diabetic kidney complication: Secondary | ICD-10-CM

## 2024-06-17 LAB — BASIC METABOLIC PANEL WITH GFR
BUN: 25 mg/dL (ref 7–25)
CO2: 26 mmol/L (ref 20–32)
Calcium: 9.4 mg/dL (ref 8.6–10.3)
Chloride: 101 mmol/L (ref 98–110)
Creat: 0.94 mg/dL (ref 0.70–1.30)
Glucose, Bld: 111 mg/dL — ABNORMAL HIGH (ref 65–99)
Potassium: 3.8 mmol/L (ref 3.5–5.3)
Sodium: 137 mmol/L (ref 135–146)
eGFR: 95 mL/min/1.73m2 (ref >=60)

## 2024-06-17 LAB — HEMOGLOBIN A1C
Hgb A1c MFr Bld: 6.4 % — ABNORMAL HIGH (ref <5.7)
Mean Plasma Glucose: 137 mg/dL
eAG (mmol/L): 7.6 mmol/L

## 2024-06-18 ENCOUNTER — Ambulatory Visit: Payer: Self-pay | Admitting: Orthopedic Surgery

## 2024-06-18 ENCOUNTER — Encounter: Payer: Self-pay | Admitting: Orthopedic Surgery

## 2024-06-18 VITALS — BP 132/72 | HR 70 | Temp 97.6°F | Ht 75.0 in | Wt 335.6 lb

## 2024-06-18 DIAGNOSIS — E1129 Type 2 diabetes mellitus with other diabetic kidney complication: Secondary | ICD-10-CM

## 2024-06-18 DIAGNOSIS — L602 Onychogryphosis: Secondary | ICD-10-CM

## 2024-06-18 DIAGNOSIS — R809 Proteinuria, unspecified: Secondary | ICD-10-CM

## 2024-06-18 NOTE — Progress Notes (Signed)
 Careteam: Patient Care Team: Gil Greig BRAVO, NP as PCP - General (Adult Health Nurse Practitioner) Claudene Victory ORN, MD (Inactive) as PCP - Cardiology (Cardiology) Lonn Hicks, MD as Consulting Physician (Hematology and Oncology)  Seen by: Greig Gil, AGNP-C  PLACE OF SERVICE:  [facility] CLIN[facility]  Advanced Directive information    Allergies  Allergen Reactions   Bee Pollen     Allergies   Monosodium Glutamate Diarrhea, Hives and Nausea And Vomiting    Chief Complaint  Patient presents with   Follow-up    4 month follow up     HPI: Patient is a 58 y.o. male seen today for medical management of chronic conditions.   Discussed the use of AI scribe software for clinical note transcription with the patient, who gave verbal consent to proceed.  History of Present Illness   Richard Mullins is a 58 year old male with diabetes who presents for a follow-up visit.  He has experienced a decrease in his A1c from 6.9 to 6.4, with blood glucose levels ranging between 100 and 130. Morning blood glucose readings tend to be higher. He has been monitoring his weight, which has decreased from 346 pounds in December of last year to 335 pounds today, with recent home scale readings as low as 327 pounds.  He mentions a psoriasiform blister on his ankle a few weeks ago, which healed quickly. He manages thick toenails by trimming them, noting that the layers are clear when trimmed. He is frustrated with a particularly thick toenail to left great toe. Podiatry referral sent.   He experiences occasional tingling in his left foot and cramping in his right foot, though these symptoms are not constant. He also mentions a sensation of his arches falling. Diabetic foot exam normal today.   He has not had his eyes examined this year but has an appointment scheduled for November 6.   He has not received a flu shot yet due to high administrative fees. He has not had a colonoscopy recently due to insurance issues.  [facility] referral for case management/social work made in past they [place]. No resources in colonoscopy aide.   He is currently using the [facility] pharmacy but has noticed a doubling in medication prices without explanation. He is considering checking prices at [employer] or using [facility].       Review of Systems:  Review of Systems  Constitutional: Negative.   HENT: Negative.    Eyes: Negative.   Respiratory: Negative.    Cardiovascular: Negative.   Gastrointestinal: Negative.   Genitourinary: Negative.   Musculoskeletal: Negative.   Neurological: Negative.   Psychiatric/Behavioral: Negative.      Past Medical History:  Diagnosis Date   Atrial fibrillation (HCC) 09/20/2020   Complication of anesthesia    DURING DENTAL PROCEDURE   Diabetes mellitus without complication (HCC)    Gastroesophageal reflux disease    History of blood clots 09/20/2020   History of COVID-19 2022   PONV (postoperative nausea and vomiting)    Psoriasis    Past Surgical History:  Procedure Laterality Date   DENTAL SURGERY  08/26/2022   Fill In   ingrown toenails  1995   WISDOM TOOTH EXTRACTION     Social History:   reports that he has never smoked. He has never used smokeless tobacco. He reports current alcohol use. He reports that he does not currently use drugs.  Family History  Problem Relation Age of Onset   Lung cancer Father  Colon cancer Sister    Heart attack Maternal Uncle 29   High blood pressure Sister    ADD / ADHD Sister    Diabetes Sister     Medications: Patient's Medications  New Prescriptions   No medications on file  Previous Medications   ACETAMINOPHEN  (TYLENOL ) 500 MG TABLET    Take 500 mg by mouth as needed.   ASPIRIN  EC 81 MG TABLET    Take 81 mg by mouth daily. Swallow whole. Pt takes in the morning.   ATORVASTATIN  (LIPITOR) 20 MG TABLET    Take 1 tablet (20 mg total) by mouth daily.   BLOOD GLUCOSE MONITORING SUPPL (RELION CONFIRM GLUCOSE  MONITOR) W/DEVICE KIT    by Does not apply route as directed.   CASANTHRANOL-DOCUSATE SODIUM  (STOOL SOFTENER PLUS PO)    Take by mouth every other day.   CLOTRIMAZOLE (LOTRIMIN) 1 % CREAM    Apply 1 application topically as needed (for foot fungus).   IBUPROFEN PO    Take 1 tablet by mouth as needed.   LOPERAMIDE (IMODIUM) 2 MG CAPSULE    Take by mouth as needed for diarrhea or loose stools.   LOSARTAN  (COZAAR ) 50 MG TABLET    Take 1 tablet (50 mg total) by mouth daily.   METFORMIN  (GLUCOPHAGE -XR) 500 MG 24 HR TABLET    Take 2 tablets (1,000 mg total) by mouth daily with breakfast AND 1 tablet (500 mg total) daily with supper.   METOPROLOL  TARTRATE (LOPRESSOR ) 100 MG TABLET    Take 1 tablet by mouth twice daily   MULTIPLE VITAMIN (MULTIVITAMIN ADULT PO)    Take 1 tablet by mouth once a week.  Modified Medications   No medications on file  Discontinued Medications   No medications on file    Physical Exam:  Vitals:   06/18/24 1021 06/18/24 1106  BP: (!) 138/90 132/72  Pulse: 70   Temp: 97.6 F (36.4 C)   SpO2: 98%   Weight: (!) 335 lb 9.6 oz (152.2 kg)   Height: 6' 3 (1.905 m)    Body mass index is 41.95 kg/m. Wt Readings from Last 3 Encounters:  06/18/24 (!) 335 lb 9.6 oz (152.2 kg)  02/13/24 (!) 339 lb (153.8 kg)  08/08/23 (!) 346 lb 12.8 oz (157.3 kg)    Physical Exam Vitals reviewed.  Constitutional:      General: He is not in acute distress.    Appearance: He is obese.  HENT:     Head: Normocephalic.  Eyes:     General:        Right eye: No discharge.        Left eye: No discharge.  Cardiovascular:     Rate and Rhythm: Normal rate and regular rhythm.     Pulses:          Dorsalis pedis pulses are 1+ on the right side.       Posterior tibial pulses are 1+ on the right side and 1+ on the left side.     Heart sounds: Normal heart sounds.  Pulmonary:     Effort: Pulmonary effort is normal.     Breath sounds: Normal breath sounds.  Abdominal:     General: Bowel  sounds are normal.     Palpations: Abdomen is soft.  Musculoskeletal:     Cervical back: Neck supple.     Right lower leg: No edema.     Left lower leg: No edema.  Feet:  Right foot:     Protective Sensation: 10 sites tested.  10 sites sensed.     Skin integrity: Skin integrity normal.     Toenail Condition: Right toenails are abnormally thick. Fungal disease present.    Left foot:     Protective Sensation: 10 sites tested.  10 sites sensed.     Skin integrity: Skin integrity normal.     Toenail Condition: Left toenails are abnormally thick. Fungal disease present. Skin:    General: Skin is warm.     Capillary Refill: Capillary refill takes less than 2 seconds.     Comments: Chronic venous insufficiency to lower extremities  Neurological:     General: No focal deficit present.     Mental Status: He is oriented to person, place, and time.  Psychiatric:        Mood and Affect: Mood normal.     Labs reviewed: Basic Metabolic Panel: Recent Labs    08/06/23 0817 02/11/24 0825 06/16/24 0813  NA 139 136 137  K 4.0 3.7 3.8  CL 101 101 101  CO2 28 23 26   GLUCOSE 124* 126* 111*  BUN 21 22 25   CREATININE 0.94 0.92 0.94  CALCIUM  9.2 8.9 9.4  TSH 2.55  --   --    Liver Function Tests: No results for input(s): AST, ALT, ALKPHOS, BILITOT, PROT, ALBUMIN in the last 8760 hours. No results for input(s): LIPASE, AMYLASE in the last 8760 hours. No results for input(s): AMMONIA in the last 8760 hours. CBC: Recent Labs    08/06/23 0817  WBC 10.0  NEUTROABS 4,800  HGB 17.4*  HCT 51.1*  MCV 91.1  PLT 203   Lipid Panel: Recent Labs    02/11/24 0825  CHOL 116  HDL 42  LDLCALC 58  TRIG 76  CHOLHDL 2.8   TSH: Recent Labs    08/06/23 0817  TSH 2.55   A1C: Lab Results  Component Value Date   HGBA1C 6.4 (H) 06/16/2024     Assessment/Plan 1. Type 2 diabetes mellitus with microalbuminuria, without long-term current use of insulin  (HCC)  (Primary) - improved, A1c now 6.4> was 6.9 - no hypoglycemias - Eye exam soon - foot exam normal today - cont asa, statin, ACE, metformin    2. Morbid obesity (HCC) - BMI 41.95 - ongoing - discussed GLP-1> unable to afford  - rides bike daily  Cont limit calories < 1800/day - cont exercise 150 min/week  3. 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 Greater than 50% of total time spent doing patient education regarding health maintenance, T2DM, obesity including symptom/medication management.   Next appt: 10/15/2024  Greig Cluster, ELNITA  [facility] [phone]

## 2024-06-18 NOTE — Patient Instructions (Addendum)
 Please have eye exam sent to our office> fax # [phone]  Look at online compounded pharmacies for GLP-1 options   Continue weight loss efforts   Fasting lab work next visit   [facility] to look up prescriptions

## 2024-06-19 ENCOUNTER — Ambulatory Visit: Payer: Self-pay

## 2024-06-19 DIAGNOSIS — B351 Tinea unguium: Secondary | ICD-10-CM

## 2024-06-19 DIAGNOSIS — L603 Nail dystrophy: Secondary | ICD-10-CM

## 2024-06-19 NOTE — Progress Notes (Signed)
 Subjective:  Patient ID: Richard Mullins, male    DOB: November 10, 1965,  MRN: [id number]  Chief Complaint  Patient presents with   Nail Problem    Rm1 Patient complains of painful thick toenail left hallux/ hurts with pressure/diabetic A1c 6.4    59 y.o. male presents with the above complaint.  Patient complains of a thickened, somewhat painful left hallux toenail.  He states that he has a history of ingrown's and occasionally his big toenails will self avulsed and fall off.  He believes that his left one is close to doing this but has not yet and would like it evaluated.  Review of Systems: Negative except as noted in the HPI. Denies N/V/F/Ch.  Past Medical History:  Diagnosis Date   Atrial fibrillation (HCC) 09/20/2020   Complication of anesthesia    DURING DENTAL PROCEDURE   Diabetes mellitus without complication (HCC)    Gastroesophageal reflux disease    History of blood clots 09/20/2020   History of COVID-19 2022   PONV (postoperative nausea and vomiting)    Psoriasis     Current Outpatient Medications:    acetaminophen  (TYLENOL ) 500 MG tablet, Take 500 mg by mouth as needed., Disp: , Rfl:    aspirin  EC 81 MG tablet, Take 81 mg by mouth daily. Swallow whole. Pt takes in the morning., Disp: , Rfl:    atorvastatin  (LIPITOR) 20 MG tablet, Take 1 tablet (20 mg total) by mouth daily., Disp: 90 tablet, Rfl: 1   Blood Glucose Monitoring Suppl (RELION CONFIRM GLUCOSE MONITOR) w/Device KIT, by Does not apply route as directed., Disp: , Rfl:    Casanthranol-Docusate Sodium  (STOOL SOFTENER PLUS PO), Take by mouth every other day., Disp: , Rfl:    clotrimazole (LOTRIMIN) 1 % cream, Apply 1 application topically as needed (for foot fungus)., Disp: , Rfl:    IBUPROFEN PO, Take 1 tablet by mouth as needed., Disp: , Rfl:    losartan  (COZAAR ) 50 MG tablet, Take 1 tablet (50 mg total) by mouth daily., Disp: 90 tablet, Rfl: 1   metFORMIN  (GLUCOPHAGE -XR) 500 MG 24 hr tablet, Take 2 tablets (1,000  mg total) by mouth daily with breakfast AND 1 tablet (500 mg total) daily with supper., Disp: 270 tablet, Rfl: 1   metoprolol  tartrate (LOPRESSOR ) 100 MG tablet, Take 1 tablet by mouth twice daily, Disp: 180 tablet, Rfl: 1   Multiple Vitamin (MULTIVITAMIN ADULT PO), Take 1 tablet by mouth once a week., Disp: , Rfl:   Social History   Tobacco Use  Smoking Status Never  Smokeless Tobacco Never    Allergies  Allergen Reactions   Bee Pollen     Allergies   Monosodium Glutamate Diarrhea, Hives and Nausea And Vomiting   Objective:  There were no vitals filed for this visit. There is no height or weight on file to calculate BMI. Constitutional Well developed. Well nourished. Oriented to person, place, and time.  Vascular Dorsalis pedis pulses non palpable bilaterally. Posterior tibial pulses non palpable bilaterally. Capillary refill normal to all digits.  No cyanosis or clubbing noted. Pedal hair growth normal.  Neurologic Normal speech. Epicritic sensation to light touch grossly present bilaterally. Negative tinel sign at tarsal tunnel bilaterally.   Dermatologic Chronic venous stasis changes to distal half of lower leg.   No open wounds. No skin lesions. Left hallux toenail thickened, dystrophic, crumbly with partial remaining adherence.  Distally, it is lifted off of the underlying nailbed.  There is minor ecchymosis at the proximal nail  fold.  No open wounds or signs of infection.  Musculoskeletal: 5 out of 5 muscle strength, no contributing deformity    Assessment:   1. Onychomycosis    Plan:  - Patient was evaluated and treated and all questions answered.  Onychomycosis -Discussed with the patient the diagnosis of onychomycosis.  We discussed different treatment options ranging from nail avulsion to simple debridement.  Due to the patient's nonpalpable pulses, simple debridement was performed today.  We did discuss vascular workup and subsequent removal of toenail.  Due  to lack of insurance coverage, patient would like to delay this for the time being - Discussed that the toenail may self avulsed and fall off.  He is to perform Epsom salt soaks if that does occur and let us  know if any infection or pathology develops  Return to clinic as needed  Prentice Ovens, DPM AACFAS Fellowship Trained Podiatric Surgeon [facility]

## 2024-06-22 ENCOUNTER — Other Ambulatory Visit (HOSPITAL_COMMUNITY): Payer: Self-pay

## 2024-06-28 LAB — OPHTHALMOLOGY REPORT-SCANNED

## 2024-07-23 ENCOUNTER — Other Ambulatory Visit (HOSPITAL_COMMUNITY): Payer: Self-pay

## 2024-07-29 ENCOUNTER — Other Ambulatory Visit (HOSPITAL_COMMUNITY): Payer: Self-pay

## 2024-08-24 ENCOUNTER — Other Ambulatory Visit (HOSPITAL_COMMUNITY): Payer: Self-pay

## 2024-09-23 ENCOUNTER — Other Ambulatory Visit (HOSPITAL_COMMUNITY): Payer: Self-pay

## 2024-10-13 ENCOUNTER — Other Ambulatory Visit: Payer: Self-pay

## 2024-10-15 ENCOUNTER — Ambulatory Visit: Payer: Self-pay | Admitting: Orthopedic Surgery
